# Patient Record
Sex: Male | Born: 1970 | Race: White | Hispanic: No | Marital: Single | Smoking: Current some day smoker
Health system: Southern US, Community
[De-identification: ages and names within clinical notes are randomized; demographics above are authoritative.]

## PROBLEM LIST (undated history)

## (undated) DIAGNOSIS — K219 Gastro-esophageal reflux disease without esophagitis: Secondary | ICD-10-CM

## (undated) DIAGNOSIS — E119 Type 2 diabetes mellitus without complications: Secondary | ICD-10-CM

## (undated) DIAGNOSIS — E785 Hyperlipidemia, unspecified: Secondary | ICD-10-CM

## (undated) DIAGNOSIS — R011 Cardiac murmur, unspecified: Secondary | ICD-10-CM

## (undated) DIAGNOSIS — N189 Chronic kidney disease, unspecified: Secondary | ICD-10-CM

## (undated) DIAGNOSIS — T7840XA Allergy, unspecified, initial encounter: Secondary | ICD-10-CM

## (undated) DIAGNOSIS — F419 Anxiety disorder, unspecified: Secondary | ICD-10-CM

## (undated) DIAGNOSIS — G4730 Sleep apnea, unspecified: Secondary | ICD-10-CM

## (undated) DIAGNOSIS — I10 Essential (primary) hypertension: Secondary | ICD-10-CM

## (undated) HISTORY — DX: Hyperlipidemia, unspecified: E78.5

## (undated) HISTORY — DX: Type 2 diabetes mellitus without complications: E11.9

## (undated) HISTORY — DX: Sleep apnea, unspecified: G47.30

## (undated) HISTORY — DX: Allergy, unspecified, initial encounter: T78.40XA

## (undated) HISTORY — DX: Gastro-esophageal reflux disease without esophagitis: K21.9

## (undated) HISTORY — DX: Anxiety disorder, unspecified: F41.9

## (undated) HISTORY — DX: Cardiac murmur, unspecified: R01.1

## (undated) HISTORY — DX: Chronic kidney disease, unspecified: N18.9

## (undated) HISTORY — PX: NO PAST SURGERIES: SHX2092

---

## 2007-02-19 ENCOUNTER — Telehealth: Payer: Self-pay | Admitting: Internal Medicine

## 2007-02-28 ENCOUNTER — Ambulatory Visit: Payer: Self-pay | Admitting: Internal Medicine

## 2007-02-28 DIAGNOSIS — F411 Generalized anxiety disorder: Secondary | ICD-10-CM

## 2007-02-28 DIAGNOSIS — IMO0002 Reserved for concepts with insufficient information to code with codable children: Secondary | ICD-10-CM

## 2007-02-28 LAB — CONVERTED CEMR LAB
Albumin: 4.5 g/dL (ref 3.5–5.2)
Alkaline Phosphatase: 61 units/L (ref 39–117)
Basophils Relative: 0.1 % (ref 0.0–1.0)
CO2: 28 meq/L (ref 19–32)
Calcium: 9.5 mg/dL (ref 8.4–10.5)
Chloride: 102 meq/L (ref 96–112)
Eosinophils Absolute: 0.1 10*3/uL (ref 0.0–0.6)
Eosinophils Relative: 0.7 % (ref 0.0–5.0)
GFR calc Af Amer: 110 mL/min
GFR calc non Af Amer: 91 mL/min
Glucose, Bld: 111 mg/dL — ABNORMAL HIGH (ref 70–99)
HCT: 44.3 % (ref 39.0–52.0)
Lymphocytes Relative: 16.2 % (ref 12.0–46.0)
MCV: 85.8 fL (ref 78.0–100.0)
Neutrophils Relative %: 77.2 % — ABNORMAL HIGH (ref 43.0–77.0)
Platelets: 287 10*3/uL (ref 150–400)
Potassium: 3.5 meq/L (ref 3.5–5.1)
RBC: 5.16 M/uL (ref 4.22–5.81)
Sodium: 138 meq/L (ref 135–145)
TSH: 1.42 microintl units/mL (ref 0.35–5.50)
WBC: 8.3 10*3/uL (ref 4.5–10.5)

## 2007-03-29 ENCOUNTER — Ambulatory Visit: Payer: Self-pay | Admitting: Internal Medicine

## 2007-04-29 ENCOUNTER — Telehealth: Payer: Self-pay | Admitting: Internal Medicine

## 2007-05-09 ENCOUNTER — Ambulatory Visit: Payer: Self-pay | Admitting: Internal Medicine

## 2010-05-17 ENCOUNTER — Other Ambulatory Visit (INDEPENDENT_AMBULATORY_CARE_PROVIDER_SITE_OTHER): Payer: BLUE CROSS/BLUE SHIELD | Admitting: Internal Medicine

## 2010-05-17 DIAGNOSIS — Z0000 Encounter for general adult medical examination without abnormal findings: Secondary | ICD-10-CM

## 2010-05-17 LAB — POCT URINALYSIS DIPSTICK
Glucose, UA: NEGATIVE
Nitrite, UA: NEGATIVE
pH, UA: 5.5

## 2010-05-17 LAB — BASIC METABOLIC PANEL
CO2: 28 mEq/L (ref 19–32)
Chloride: 102 mEq/L (ref 96–112)
Potassium: 3.8 mEq/L (ref 3.5–5.1)
Sodium: 143 mEq/L (ref 135–145)

## 2010-05-17 LAB — LIPID PANEL: HDL: 36.6 mg/dL — ABNORMAL LOW (ref >39.00)

## 2010-05-17 LAB — TSH: TSH: 1.63 u[IU]/mL (ref 0.35–5.50)

## 2010-05-17 LAB — CBC WITH DIFFERENTIAL/PLATELET
Basophils Relative: 0.7 % (ref 0.0–3.0)
Eosinophils Absolute: 0.3 10*3/uL (ref 0.0–0.7)
Eosinophils Relative: 2.1 % (ref 0.0–5.0)
Hemoglobin: 15.5 g/dL (ref 13.0–17.0)
Lymphocytes Relative: 18.9 % (ref 12.0–46.0)
MCHC: 34.3 g/dL (ref 30.0–36.0)
MCV: 87 fl (ref 78.0–100.0)
Monocytes Absolute: 0.6 10*3/uL (ref 0.1–1.0)
Neutro Abs: 8.7 10*3/uL — ABNORMAL HIGH (ref 1.4–7.7)
RBC: 5.2 Mil/uL (ref 4.22–5.81)

## 2010-05-17 LAB — HEPATIC FUNCTION PANEL
ALT: 28 U/L (ref 0–53)
Albumin: 4.3 g/dL (ref 3.5–5.2)
Alkaline Phosphatase: 69 U/L (ref 39–117)
Total Protein: 7.6 g/dL (ref 6.0–8.3)

## 2010-05-20 ENCOUNTER — Encounter: Payer: Self-pay | Admitting: Internal Medicine

## 2010-05-24 ENCOUNTER — Ambulatory Visit (INDEPENDENT_AMBULATORY_CARE_PROVIDER_SITE_OTHER): Payer: BLUE CROSS/BLUE SHIELD | Admitting: Internal Medicine

## 2010-05-24 ENCOUNTER — Encounter: Payer: Self-pay | Admitting: Internal Medicine

## 2010-05-24 DIAGNOSIS — IMO0002 Reserved for concepts with insufficient information to code with codable children: Secondary | ICD-10-CM

## 2010-05-24 DIAGNOSIS — Z0000 Encounter for general adult medical examination without abnormal findings: Secondary | ICD-10-CM

## 2010-05-24 DIAGNOSIS — F411 Generalized anxiety disorder: Secondary | ICD-10-CM

## 2010-05-24 DIAGNOSIS — G4733 Obstructive sleep apnea (adult) (pediatric): Secondary | ICD-10-CM

## 2010-05-24 MED ORDER — ALPRAZOLAM 0.5 MG PO TABS: 0.5000 mg | ORAL_TABLET | Freq: Three times a day (TID) | ORAL | Status: DC | PRN

## 2010-05-24 NOTE — Patient Instructions (Signed)
You need to lose weight.  Consider a lower calorie diet and regular exercise.  Limit your sodium (Salt) intake    It is important that you exercise regularly, at least 20 minutes 3 to 4 times per week.  If you develop chest pain or shortness of breath seek  medical attention.  Please check your blood pressure on a regular basis.  If it is consistently greater than 150/90, please make an office appointment.

## 2010-05-24 NOTE — Progress Notes (Signed)
Subjective:    Patient ID: Travis King, male    DOB: 06-Oct-1970, 40 y.o.   MRN: [id number]  HPI  40 year old patient who is seen today for a wellness exam. He has a history of exogenous obesity and unfortunately there is been some weight gain over the past few years. He has been a hypertensive suspect and has a strong family history of hypertension. There has been felt to be a component of white coat hypertension as well. Blood pressure readings are generally in a high normal range He has always been a loud snorer and there is some daytime fatigue and occasional daytime sleepiness. He has a history of anxiety disorder and did not do well on sertraline that interfered with his sleep he has not had angular for some time but feels that he would benefit from when necessary use of alprazolam which was quite helpful in the past   Review of Systems  Constitutional: Positive for unexpected weight change. Negative for fever, chills, activity change, appetite change and fatigue.  HENT: Negative for hearing loss, ear pain, congestion, rhinorrhea, sneezing, mouth sores, trouble swallowing, neck pain, neck stiffness, dental problem, voice change, sinus pressure and tinnitus.   Eyes: Negative for photophobia, pain, redness and visual disturbance.  Respiratory: Negative for apnea, cough, choking, chest tightness, shortness of breath and wheezing.   Cardiovascular: Negative for chest pain, palpitations and leg swelling.  Gastrointestinal: Negative for nausea, vomiting, abdominal pain, diarrhea, constipation, blood in stool, abdominal distention, anal bleeding and rectal pain.  Genitourinary: Negative for dysuria, urgency, frequency, hematuria, flank pain, decreased urine volume, discharge, penile swelling, scrotal swelling, difficulty urinating, genital sores and testicular pain.  Musculoskeletal: Negative for myalgias, back pain, joint swelling, arthralgias and gait problem.  Skin: Negative for color  change, rash and wound.  Neurological: Negative for dizziness, tremors, seizures, syncope, facial asymmetry, speech difficulty, weakness, light-headedness, numbness and headaches.  Hematological: Negative for adenopathy. Does not bruise/bleed easily.  Psychiatric/Behavioral: Negative for suicidal ideas, hallucinations, behavioral problems, confusion, sleep disturbance, self-injury, dysphoric mood, decreased concentration and agitation. The patient is nervous/anxious.        Objective:   Physical Exam  Constitutional: He appears well-developed and well-nourished.       Obese. Lowest blood pressure 130/90  HENT:  Head: Normocephalic and atraumatic.  Right Ear: External ear normal.  Left Ear: External ear normal.  Nose: Nose normal.  Mouth/Throat: Oropharynx is clear and moist.  Eyes: Conjunctivae and EOM are normal. Pupils are equal, round, and reactive to light. No scleral icterus.  Neck: Normal range of motion. Neck supple. No JVD present. No thyromegaly present.  Cardiovascular: Regular rhythm, normal heart sounds and intact distal pulses.  Exam reveals no gallop and no friction rub.   No murmur heard. Pulmonary/Chest: Effort normal and breath sounds normal. He exhibits no tenderness.  Abdominal: Soft. Bowel sounds are normal. He exhibits no distension and no mass. There is no tenderness.  Genitourinary: Prostate normal and penis normal.  Musculoskeletal: Normal range of motion. He exhibits no edema and no tenderness.  Lymphadenopathy:    He has no cervical adenopathy.  Neurological: He is alert. He has normal reflexes. No cranial nerve deficit. Coordination normal.  Skin: Skin is warm and dry. No rash noted.  Psychiatric: He has a normal mood and affect. His behavior is normal.          Assessment & Plan:  Exogenous obesity Rule out obstructive sleep apnea Hypertensive suspect. We'll continue to monitor home blood pressure  readings weight loss diet and home blood pressure  monitoring encouraged as well as a restricted salt diet Anxiety disorder. Will refill when necessary use of alprazolam which has been quite helpful in the past

## 2010-09-20 ENCOUNTER — Telehealth: Payer: Self-pay | Admitting: *Deleted

## 2010-09-20 NOTE — Telephone Encounter (Signed)
[facility] Triage Call Report Triage Record Num: [id number] Operator: Migdalia Dk Patient Name: Travis King Call Date & Time: 09/20/2010 1:14:40AM Patient Phone: [phone] PCP: Gordy Savers Patient Gender: Male PCP Fax : [phone] Patient DOB: Jan 16, 1971 [facility] Name[facility] Reason for Call: Pt. states has been on modified Carb diet over past week and has had problems with constipation. "I think I developed a hemorrhoid and I was using Abx cream on it. It had been oozing a clear liquid over the past couple of days. I noticed tonight when I put more cream on it was bleeding." States having small amount of bleeding from hemorrhoid tonight, "the pain is actually better", no constant rectal pain. Homecare advice given per Rectal Symptoms Guideline. Pt. to call office this a.m. for appt. Protocol(s) Used: Rectal Symptoms Recommended Outcome per Protocol: See Provider within 2 Weeks Reason for Outcome: Scant rectal bleeding (bright red blood on toilet tissue or drops of blood in toilet water) when straining at stool and not previously evaluated Care Advice: ~ Call provider if symptoms worsen or new symptoms develop. Consider use of Milk of Magnesia or other mild laxative, as directed on label, at bedtime (persons with renal disease should avoid Milk of Magnesia). Do not use laxatives or enemas regularly. ~ ~ Do not sit on toilet for a prolonged time (reading while on toilet) and avoid straining at stool. Consider use of nonprescription anti-inflammatory topical medication per pharmacist's or label recommendations (such as Preparation H, Anusol-HC, Cortacet) to relieve itching or burning. Apply small amount to area. ~ Constipation Care Measures: - Drink 8 to 10 glasses of liquid per day, more if breastfeeding. - Drink warm water or coffee early in the morning. - Gradually increase dietary fiber (fresh fruits/vegetables, whole grain bread and cereals). - As  tolerated, walk 30 minutes at a steady pace daily. - Consider nonprescription stool softeners (Colace) per label, pharmacist or provider recommendations. Stool softners are not habit forming as some stimulant laxatives may become. - Consider nonprescription bulk forming laxatives (such as Metamucil, FiberCon, Citrucel, etc.); follow package directions. - Avoid routine use of strong laxatives/enemas/suppositories unless ordered by provider. - Do not delay having bowel movement when having urge. - Keep a routine; attempt bowel movement within half hour after a meal or after some exercise. ~ 09/20/2010 1:26:18AM Page 1 of 1 CAN_TriageRpt_

## 2010-09-21 ENCOUNTER — Ambulatory Visit: Payer: BLUE CROSS/BLUE SHIELD | Admitting: Internal Medicine

## 2010-09-27 ENCOUNTER — Ambulatory Visit: Payer: BLUE CROSS/BLUE SHIELD | Admitting: Internal Medicine

## 2012-04-11 ENCOUNTER — Ambulatory Visit (INDEPENDENT_AMBULATORY_CARE_PROVIDER_SITE_OTHER): Payer: BC Managed Care – PPO | Admitting: Internal Medicine

## 2012-04-11 ENCOUNTER — Encounter: Payer: Self-pay | Admitting: Internal Medicine

## 2012-04-11 VITALS — BP 150/100 | Temp 98.1°F | Wt 282.0 lb

## 2012-04-11 DIAGNOSIS — E6609 Other obesity due to excess calories: Secondary | ICD-10-CM | POA: Insufficient documentation

## 2012-04-11 DIAGNOSIS — G4733 Obstructive sleep apnea (adult) (pediatric): Secondary | ICD-10-CM

## 2012-04-11 DIAGNOSIS — E669 Obesity, unspecified: Secondary | ICD-10-CM

## 2012-04-11 MED ORDER — ALPRAZOLAM 0.5 MG PO TABS: 0.5000 mg | ORAL_TABLET | Freq: Three times a day (TID) | ORAL | Status: AC | PRN

## 2012-04-11 MED ORDER — SILDENAFIL CITRATE 100 MG PO TABS: 100.0000 mg | ORAL_TABLET | Freq: Every day | ORAL | Status: DC | PRN

## 2012-04-11 MED ORDER — FLUTICASONE PROPIONATE 50 MCG/ACT NA SUSP: 2.0000 | Freq: Every day | NASAL | Status: DC

## 2012-04-11 NOTE — Progress Notes (Signed)
  Subjective:    Patient ID: Travis King, male    DOB: [birth year], 42 y.o.   MRN: [id number]  HPI  42 year old patient who is seen today for followup. He has a number of concerns. He had a number of questions concerning  supplements. He is exercising at a health club regularly and attempting to lose weight. He does have a history of OSA and uses home CPAP infrequently.  BP Readings from Last 3 Encounters:  04/11/12 150/100  05/24/10 150/90  05/09/07 140/90    Review of Systems  Constitutional: Positive for fatigue. Negative for fever, chills and appetite change.  HENT: Positive for congestion. Negative for hearing loss, ear pain, sore throat, trouble swallowing, neck stiffness, dental problem, voice change and tinnitus.   Eyes: Negative for pain, discharge and visual disturbance.  Respiratory: Negative for cough, chest tightness, wheezing and stridor.   Cardiovascular: Negative for chest pain, palpitations and leg swelling.  Gastrointestinal: Negative for nausea, vomiting, abdominal pain, diarrhea, constipation, blood in stool and abdominal distention.  Genitourinary: Negative for urgency, hematuria, flank pain, discharge, difficulty urinating and genital sores.  Musculoskeletal: Negative for myalgias, back pain, joint swelling, arthralgias and gait problem.  Skin: Negative for rash.  Neurological: Negative for dizziness, syncope, speech difficulty, weakness, numbness and headaches.  Hematological: Negative for adenopathy. Does not bruise/bleed easily.  Psychiatric/Behavioral: Negative for behavioral problems and dysphoric mood. The patient is not nervous/anxious.        Objective:   Physical Exam  Constitutional: He is oriented to person, place, and time. He appears well-developed.  Weight 282 Repeat blood pressure 140/90  HENT:  Head: Normocephalic.  Right Ear: External ear normal.  Left Ear: External ear normal.  Eyes: Conjunctivae and EOM are normal.  Neck: Normal range  of motion.  Cardiovascular: Normal rate and normal heart sounds.   Pulmonary/Chest: Breath sounds normal.  Abdominal: Bowel sounds are normal.  Musculoskeletal: Normal range of motion. He exhibits no edema and no tenderness.  Neurological: He is alert and oriented to person, place, and time.  Psychiatric: He has a normal mood and affect. His behavior is normal.          Assessment & Plan:   Exogenous obesity OSA. Compliance with home CPAP encouraged  Weight loss regular exercise regimen encouraged home blood pressure monitoring encouraged

## 2012-04-11 NOTE — Patient Instructions (Addendum)
It is important that you exercise regularly, at least 20 minutes 3 to 4 times per week.  If you develop chest pain or shortness of breath seek  medical attention.  You need to lose weight.  Consider a lower calorie diet and regular exercise.   Use saline irrigation, warm  moist compresses and over-the-counter decongestants only as directed.  Call if there is no improvement in 5 to 7 days, or sooner if you develop increasing pain, fever, or any new symptoms.

## 2012-06-07 ENCOUNTER — Telehealth: Payer: Self-pay | Admitting: Internal Medicine

## 2012-06-07 NOTE — Telephone Encounter (Signed)
[facility] Triage Call Report Triage Record Num: [id number] Operator: Rene Kocher Patient Name: Travis King Call Date & Time: 06/06/2012 9:33:16PM Patient Phone: [phone] PCP: Patient Gender: Male PCP Fax : Patient DOB: [birth year] [facility] Name[facility] Reason for Call: Caller: [name]/Patient; PCP: Eleonore Chiquito [facility]); CB#: [phone]; Call regarding Questions about xanex, re: traveling; 06/06/12 patient calling to see if he can take Xanax 06/07/12 during a business trip and then consume ETOH at the business dinner several hours later. Called [facility] [phone], [address], and connected patient with Wynetta Emery, RPh to give advise for medication. No further needs at this time. Protocol(s) Used: Information Only Call; No Symptom Triage (Adult) Recommended Outcome per Protocol: Provide Information or Advice Only Reason for Outcome: Health information question; person denies any symptoms, no triage required. Information provided from approved references or clinical experience. Care Advice: ~ 05/

## 2013-03-25 ENCOUNTER — Emergency Department (HOSPITAL_COMMUNITY)
Admission: EM | Admit: 2013-03-25 | Discharge: 2013-03-25 | Disposition: A | Discharge status: Home / Self Care | Payer: BC Managed Care – PPO | Source: Home / Self Care

## 2013-03-25 ENCOUNTER — Encounter (HOSPITAL_COMMUNITY): Payer: Self-pay | Admitting: Emergency Medicine

## 2013-03-25 DIAGNOSIS — J029 Acute pharyngitis, unspecified: Secondary | ICD-10-CM

## 2013-03-25 DIAGNOSIS — J390 Retropharyngeal and parapharyngeal abscess: Secondary | ICD-10-CM

## 2013-03-25 HISTORY — DX: Essential (primary) hypertension: I10

## 2013-03-25 LAB — POCT RAPID STREP A: STREPTOCOCCUS, GROUP A SCREEN (DIRECT): NEGATIVE

## 2013-03-25 MED ORDER — CLINDAMYCIN HCL 300 MG PO CAPS: 300.0000 mg | ORAL_CAPSULE | Freq: Four times a day (QID) | ORAL | Status: DC

## 2013-03-25 NOTE — ED Provider Notes (Signed)
CSN: [id number]     Arrival date & time 03/25/13  1124 History   First MD Initiated Contact with Patient 03/25/13 1132     Chief Complaint  Patient presents with  . Sore Throat     (Consider location/radiation/quality/duration/timing/severity/associated sxs/prior Treatment) HPI Comments: -year-old obese male complaining of swelling of the tonsils and sore throat that began about 4 days ago. This morning he states he had an otic temperature of 100. [place] 3 weeks ago he was treated by his PCP for positive strep throat. He took antibiotics for the full 10 days and felt much better. He uses CPAP and states he forgot to clean it and believes he may have re\re contaminated himself.   Past Medical History  Diagnosis Date  . Anxiety   . Hypertension    History reviewed. No pertinent past surgical history. Family History  Problem Relation Age of Onset  . Heart attack Mother   . Hypertension Mother   . Diabetes Mother   . Dementia Father    History  Substance Use Topics  . Smoking status: Current Some Day Smoker    Types: Cigars  . Smokeless tobacco: Never Used  . Alcohol Use: Yes    Review of Systems  Constitutional: Positive for fever and appetite change.  HENT: Positive for sore throat and voice change. Negative for congestion, ear pain, postnasal drip, rhinorrhea, sinus pressure and trouble swallowing.   Eyes: Negative.   Respiratory: Negative.   Cardiovascular: Negative.   Gastrointestinal: Negative.   Genitourinary: Negative.   Neurological: Negative.       Allergies  Review of patient's allergies indicates no known allergies.  Home Medications   Current Outpatient Rx  Name  Route  Sig  Dispense  Refill  . ALPRAZolam (XANAX) 0.5 MG tablet   Oral   Take 1 tablet (0.5 mg total) by mouth 3 (three) times daily as needed for sleep or anxiety.   30 tablet   3   . clindamycin (CLEOCIN) 300 MG capsule   Oral   Take 1 capsule (300 mg total) by mouth 4 (four) times  daily. X 10 days   40 capsule   0   . fluticasone (FLONASE) 50 MCG/ACT nasal spray   Nasal   Place 2 sprays into the nose daily.   16 g   6   . sildenafil (VIAGRA) 100 MG tablet   Oral   Take 1 tablet (100 mg total) by mouth daily as needed for erectile dysfunction.   3 tablet   11    BP 152/108  Pulse 72  Temp(Src) 98.6 F (37 C) (Oral)  Resp 18  SpO2 100% Physical Exam  Nursing note and vitals reviewed. Constitutional: He is oriented to person, place, and time. He appears well-developed and well-nourished. No distress.  HENT:  Right Ear: External ear normal.  Left Ear: External ear normal.  Mouth/Throat: Oropharyngeal exudate present.  Oropharynx is crowded by redundant tissue as the patient is obese. The right tonsil is enlarged and erythematous. There is a bulge in the right palatine arch and forcing the uvula left of midline. The airway is narrowed but patent.  Eyes: EOM are normal.  Neck: Normal range of motion. Neck supple.  Cardiovascular: Normal rate.   Pulmonary/Chest: Effort normal. No respiratory distress.  Musculoskeletal: He exhibits no edema.  Neurological: He is alert and oriented to person, place, and time. He exhibits normal muscle tone.  Skin: Skin is warm and dry.    ED Course  Procedures (including critical care time) Labs Review Labs Reviewed  POCT RAPID STREP A ([facility] ONLY)   Imaging Review No results found. Results for orders placed during the hospital encounter of 03/25/13  POCT RAPID STREP A ([facility] ONLY)      Result Value Ref Range   Streptococcus, Group A Screen (Direct) NEGATIVE  NEGATIVE      MDM   Final diagnoses:  Pharyngitis  Parapharyngeal abscess    Spoke with Dr. Jearld Fenton via phone. Agreed to tx with clindamycin and see in office tommorow.  Pt advised for any worsening, narrowing of airway, choking, developing fevers go to the ED. D/C in stable condition, airway patent, relaxed posturing, no Sh OB, afebrile,  able to speak well with good voice tone.    Hayden Rasmussen, NP 03/25/13 1229  Hayden Rasmussen, NP 03/25/13 1230  Hayden Rasmussen, NP 03/25/13 1235

## 2013-03-25 NOTE — ED Notes (Signed)
Pt  Reports  Symptoms  Of  sorethroat         Pain  When  He  Swallows     With  The  Symptoms   X  3  Days   -  He  Reports  Was  Seen   sev  Weeks   Ago                     For  Throat infection

## 2013-03-25 NOTE — Discharge Instructions (Signed)
Retropharyngeal Abscess A retropharyngeal abscess is an abscess that is located in the back of the throat. An abscess is an area infected by bacteria and contains a collection of pus.  SYMPTOMS   On one or both sides of the back of the throat, an abscess usually causes:  Swelling.  Pain.  Tenderness.  Inflammation.  High fever.  Stiff neck.  Occasionally, difficulty breathing.  Difficulty or pain when swallowing or both.  Muffled voice. DIAGNOSIS  The diagnosis is often made after a physical exam. Sometimes specialized X-ray exams are done. TREATMENT  The treatment of retropharyngeal abscess usually consists of:  Antibiotic medicines.  Drainage (an incision is made over the abscess, and the pus is drained out of the abscess). HOME CARE INSTRUCTIONS  Only take over-the-counter or prescription medicines for pain, discomfort, or fever as directed by your health care provider.  SEEK MEDICAL CARE IF:  You cannot swallow or you are starting to drool because of pain when you swallow.  You are not able to lie flat without feeling short of breath. SEEK IMMEDIATE MEDICAL CARE IF:   You develop increased pain, swelling, drainage, or bleeding in the wound site.  You develop signs of generalized infection including muscle aches, chills, fever, or a general ill feeling.  You develop a stiff neck or severe headache not relieved with medicines.  You have a fever.  You have a worsening of any of the problems that brought you to your health care provider. MAKE SURE YOU:  Understand these instructions.   Will watch your condition.   Will get help right away if you are not doing well or get worse. Document Released: 05/07/2000 Document Revised: 11/13/2012 Document Reviewed: 08/20/2012 [facility] Patient Information 2014 [place], [place].  Sore Throat A sore throat is pain, burning, irritation, or scratchiness of the throat. There is often pain or tenderness when swallowing or  talking. A sore throat may be accompanied by other symptoms, such as coughing, sneezing, fever, and swollen neck glands. A sore throat is often the first sign of another sickness, such as a cold, flu, strep throat, or mononucleosis (commonly known as mono). Most sore throats go away without medical treatment. CAUSES  The most common causes of a sore throat include:  A viral infection, such as a cold, flu, or mono.  A bacterial infection, such as strep throat, tonsillitis, or whooping cough.  Seasonal allergies.  Dryness in the air.  Irritants, such as smoke or pollution.  Gastroesophageal reflux disease (GERD). HOME CARE INSTRUCTIONS   Only take over-the-counter medicines as directed by your caregiver.  Drink enough fluids to keep your urine clear or pale yellow.  Rest as needed.  Try using throat sprays, lozenges, or sucking on hard candy to ease any pain (if older than 4 years or as directed).  Sip warm liquids, such as broth, herbal tea, or warm water with honey to relieve pain temporarily. You may also eat or drink cold or frozen liquids such as frozen ice pops.  Gargle with salt water (mix 1 tsp salt with 8 oz of water).  Do not smoke and avoid secondhand smoke.  Put a cool-mist humidifier in your bedroom at night to moisten the air. You can also turn on a hot shower and sit in the bathroom with the door closed for 5 10 minutes. SEEK IMMEDIATE MEDICAL CARE IF:  You have difficulty breathing.  You are unable to swallow fluids, soft foods, or your saliva.  You have increased swelling in  the throat.  Your sore throat does not get better in 7 days.  You have nausea and vomiting.  You have a fever or persistent symptoms for more than 2 3 days.  You have a fever and your symptoms suddenly get worse. MAKE SURE YOU:   Understand these instructions.  Will watch your condition.  Will get help right away if you are not doing well or get worse. Document Released:  03/02/2004 Document Revised: 01/10/2012 Document Reviewed: 10/01/2011 [facility] Patient Information 2014 [place], [place].

## 2013-03-26 ENCOUNTER — Telehealth (HOSPITAL_COMMUNITY): Payer: Self-pay | Admitting: *Deleted

## 2013-03-26 LAB — CULTURE, GROUP A STREP

## 2013-03-26 NOTE — ED Notes (Signed)
Throat culture: group A Strep (S. Pyogenes).  Pt. adequately treated with Cleocin per Dr. Artis FlockKindl. Travis King, Travis King 03/26/2013

## 2013-03-26 NOTE — ED Provider Notes (Signed)
Medical screening examination/treatment/procedure(s) were performed by resident physician or non-physician practitioner and as supervising physician I was immediately available for consultation/collaboration.   Eleno Weimar DOUGLAS MD.   Lettie Czarnecki D Alzena Gerber, MD 03/26/13 2008 

## 2013-04-24 ENCOUNTER — Emergency Department (HOSPITAL_COMMUNITY): Payer: BC Managed Care – PPO

## 2013-04-24 ENCOUNTER — Emergency Department (HOSPITAL_COMMUNITY)
Admission: EM | Admit: 2013-04-24 | Discharge: 2013-04-24 | Disposition: A | Discharge status: Home / Self Care | Payer: BC Managed Care – PPO | Attending: Emergency Medicine | Admitting: Emergency Medicine

## 2013-04-24 ENCOUNTER — Encounter (HOSPITAL_COMMUNITY): Payer: Self-pay | Admitting: Emergency Medicine

## 2013-04-24 DIAGNOSIS — I10 Essential (primary) hypertension: Secondary | ICD-10-CM | POA: Insufficient documentation

## 2013-04-24 DIAGNOSIS — F17200 Nicotine dependence, unspecified, uncomplicated: Secondary | ICD-10-CM | POA: Insufficient documentation

## 2013-04-24 DIAGNOSIS — J36 Peritonsillar abscess: Secondary | ICD-10-CM | POA: Insufficient documentation

## 2013-04-24 DIAGNOSIS — Z8659 Personal history of other mental and behavioral disorders: Secondary | ICD-10-CM | POA: Insufficient documentation

## 2013-04-24 DIAGNOSIS — R000 Tachycardia, unspecified: Secondary | ICD-10-CM | POA: Insufficient documentation

## 2013-04-24 DIAGNOSIS — Z792 Long term (current) use of antibiotics: Secondary | ICD-10-CM | POA: Insufficient documentation

## 2013-04-24 DIAGNOSIS — IMO0002 Reserved for concepts with insufficient information to code with codable children: Secondary | ICD-10-CM | POA: Insufficient documentation

## 2013-04-24 DIAGNOSIS — J020 Streptococcal pharyngitis: Secondary | ICD-10-CM | POA: Insufficient documentation

## 2013-04-24 LAB — CBC WITH DIFFERENTIAL/PLATELET
BASOS PCT: 0 % (ref 0–1)
Basophils Absolute: 0.1 10*3/uL (ref 0.0–0.1)
EOS ABS: 0.1 10*3/uL (ref 0.0–0.7)
Eosinophils Relative: 1 % (ref 0–5)
HEMATOCRIT: 43.7 % (ref 39.0–52.0)
HEMOGLOBIN: 14.9 g/dL (ref 13.0–17.0)
Lymphocytes Relative: 14 % (ref 12–46)
Lymphs Abs: 2.4 10*3/uL (ref 0.7–4.0)
MCH: 29.2 pg (ref 26.0–34.0)
MCHC: 34.1 g/dL (ref 30.0–36.0)
MCV: 85.5 fL (ref 78.0–100.0)
MONO ABS: 1.1 10*3/uL — AB (ref 0.1–1.0)
Monocytes Relative: 7 % (ref 3–12)
Neutro Abs: 12.9 10*3/uL — ABNORMAL HIGH (ref 1.7–7.7)
Neutrophils Relative %: 78 % — ABNORMAL HIGH (ref 43–77)
Platelets: 309 10*3/uL (ref 150–400)
RBC: 5.11 MIL/uL (ref 4.22–5.81)
RDW: 13.1 % (ref 11.5–15.5)
WBC: 16.6 10*3/uL — ABNORMAL HIGH (ref 4.0–10.5)

## 2013-04-24 LAB — COMPREHENSIVE METABOLIC PANEL
ALBUMIN: 3.7 g/dL (ref 3.5–5.2)
ALT: 26 U/L (ref 0–53)
AST: 15 U/L (ref 0–37)
Alkaline Phosphatase: 67 U/L (ref 39–117)
BILIRUBIN TOTAL: 0.6 mg/dL (ref 0.3–1.2)
BUN: 11 mg/dL (ref 6–23)
CHLORIDE: 99 meq/L (ref 96–112)
CO2: 24 mEq/L (ref 19–32)
CREATININE: 1.1 mg/dL (ref 0.50–1.35)
Calcium: 9.1 mg/dL (ref 8.4–10.5)
GFR calc non Af Amer: 82 mL/min — ABNORMAL LOW (ref >90)
Glucose, Bld: 130 mg/dL — ABNORMAL HIGH (ref 70–99)
Potassium: 3.6 mEq/L — ABNORMAL LOW (ref 3.7–5.3)
Sodium: 140 mEq/L (ref 137–147)
TOTAL PROTEIN: 7.3 g/dL (ref 6.0–8.3)

## 2013-04-24 LAB — RAPID STREP SCREEN (MED CTR MEBANE ONLY): Streptococcus, Group A Screen (Direct): POSITIVE — AB

## 2013-04-24 MED ORDER — DEXAMETHASONE SODIUM PHOSPHATE 10 MG/ML IJ SOLN
10.0000 mg | Freq: Once | INTRAMUSCULAR | Status: AC
  Administered 2013-04-24: 10 mg via INTRAVENOUS
  Filled 2013-04-24: qty 1

## 2013-04-24 MED ORDER — DEXTROSE 5 % IV SOLN
1.0000 g | Freq: Once | INTRAVENOUS | Status: AC
  Administered 2013-04-24: 1 g via INTRAVENOUS
  Filled 2013-04-24: qty 10

## 2013-04-24 MED ORDER — SODIUM CHLORIDE 0.9 % IV SOLN
1000.0000 mL | Freq: Once | INTRAVENOUS | Status: AC
  Administered 2013-04-24: 1000 mL via INTRAVENOUS

## 2013-04-24 MED ORDER — SODIUM CHLORIDE 0.9 % IV SOLN: 1000.0000 mL | INTRAVENOUS | Status: DC

## 2013-04-24 MED ORDER — IOHEXOL 300 MG/ML  SOLN
80.0000 mL | Freq: Once | INTRAMUSCULAR | Status: AC | PRN
  Administered 2013-04-24: 80 mL via INTRAVENOUS

## 2013-04-24 MED ORDER — GI COCKTAIL ~~LOC~~
30.0000 mL | Freq: Once | ORAL | Status: AC
  Administered 2013-04-24: 30 mL via ORAL
  Filled 2013-04-24: qty 30

## 2013-04-24 NOTE — Discharge Instructions (Signed)
Go directly to Dr. Lucky Rathkeosen's office. He will take care of the abscess there.  Peritonsillar Abscess A peritonsillar abscess is a collection of pus located in the back of the throat behind the tonsils. It usually occurs when a streptococcal infection of the throat or tonsils spreads into the space around the tonsils. They are almost always caused by the streptococcal germ (bacteria). The treatment of a peritonsillar abscess is most often drainage accomplished by putting a needle into the abscess or cutting (incising) and draining the abscess. This is most often followed with a course of antibiotics. HOME CARE INSTRUCTIONS  If your abscess was drained by your caregiver today, rinse your throat (gargle) with warm salt water four times per day or as needed for comfort. Do not swallow this mixture. Mix 1 teaspoon of salt in 8 ounces of warm water for gargling.  Rest in bed as needed. Resume activities as able.  Apply cold to your neck for pain relief. Fill a plastic bag with ice and wrap it in a towel. Hold the ice on your neck for 20 minutes 4 times per day.  Eat a soft or liquid diet as tolerated while your throat remains sore. Popsicles and ice cream may be good early choices. Drinking plenty of cold fluids will probably be soothing and help take swelling down in between the warm gargles.  Only take over-the-counter or prescription medicines for pain, discomfort, or fever as directed by your caregiver. Do not use aspirin unless directed by your physician. Aspirin slows down the clotting process. It can also cause bleeding from the drainage area if this was needled or incised today.  If antibiotics were prescribed, take them as directed for the full course of the prescription. Even if you feel you are well, you need to take them. SEEK MEDICAL CARE IF:   You have increased pain, swelling, redness, or drainage in your throat.  You develop signs of infection such as dizziness, headache, lethargy, or  generalized feelings of illness.  You have difficulty breathing, swallowing or eating.  You show signs of becoming dehydrated (lightheadedness when standing, decreased urine output, a fast heart rate, or dry mouth and mucous membranes). SEEK IMMEDIATE MEDICAL CARE IF:   You have a fever.  You are coughing up or vomiting blood.  You develop more severe throat pain uncontrolled with medicines or you start to drool.  You develop difficulty breathing, talking, or find it easier to breathe while leaning forward. Document Released: 01/23/2005 Document Revised: 04/17/2011 Document Reviewed: 09/06/2007 [facility] Patient Information 2014 [employer] [place].  Strep Throat Strep throat is an infection of the throat caused by a bacteria named Streptococcus pyogenes. Your caregiver may call the infection streptococcal "tonsillitis" or "pharyngitis" depending on whether there are signs of inflammation in the tonsils or back of the throat. Strep throat is most common in children aged 5 15 years during the cold months of the year, but it can occur in people of any age during any season. This infection is spread from person to person (contagious) through coughing, sneezing, or other close contact. SYMPTOMS   Fever or chills.  Painful, swollen, red tonsils or throat.  Pain or difficulty when swallowing.  White or yellow spots on the tonsils or throat.  Swollen, tender lymph nodes or "glands" of the neck or under the jaw.  Red rash all over the body (rare). DIAGNOSIS  Many different infections can cause the same symptoms. A test must be done to confirm the diagnosis so the  right treatment can be given. A "rapid strep test" can help your caregiver make the diagnosis in a few minutes. If this test is not available, a light swab of the infected area can be used for a throat culture test. If a throat culture test is done, results are usually available in a day or two. TREATMENT  Strep throat is treated with  antibiotic medicine. HOME CARE INSTRUCTIONS   Gargle with 1 tsp of salt in 1 cup of warm water, 3 4 times per day or as needed for comfort.  Family members who also have a sore throat or fever should be tested for strep throat and treated with antibiotics if they have the strep infection.  Make sure everyone in your household washes their hands well.  Do not share food, drinking cups, or personal items that could cause the infection to spread to others.  You may need to eat a soft food diet until your sore throat gets better.  Drink enough water and fluids to keep your urine clear or pale yellow. This will help prevent dehydration.  Get plenty of rest.  Stay home from school, daycare, or work until you have been on antibiotics for 24 hours.  Only take over-the-counter or prescription medicines for pain, discomfort, or fever as directed by your caregiver.  If antibiotics are prescribed, take them as directed. Finish them even if you start to feel better. SEEK MEDICAL CARE IF:   The glands in your neck continue to enlarge.  You develop a rash, cough, or earache.  You cough up green, yellow-brown, or bloody sputum.  You have pain or discomfort not controlled by medicines.  Your problems seem to be getting worse rather than better. SEEK IMMEDIATE MEDICAL CARE IF:   You develop any new symptoms such as vomiting, severe headache, stiff or painful neck, chest pain, shortness of breath, or trouble swallowing.  You develop severe throat pain, drooling, or changes in your voice.  You develop swelling of the neck, or the skin on the neck becomes red and tender.  You have a fever.  You develop signs of dehydration, such as fatigue, dry mouth, and decreased urination.  You become increasingly sleepy, or you cannot wake up completely. Document Released: 01/21/2000 Document Revised: 01/10/2012 Document Reviewed: 03/24/2010 [facility] Patient Information 2014 [place], [place].

## 2013-04-24 NOTE — ED Notes (Signed)
Pt reports having strep throat twice in the past month. Pt reports finishing antibiotics 5 days ago. Pt said that he has had intermittent swelling to his throat for the past 3 days but felt like it was much worse today.

## 2013-04-24 NOTE — ED Notes (Signed)
Pt will be going to ENT dr at 9 am antibiotic done

## 2013-04-24 NOTE — ED Provider Notes (Signed)
CSN: [id number]     Arrival date & time 04/24/13  0442 History   First MD Initiated Contact with Patient 04/24/13 0507     Chief Complaint  Patient presents with  . Sore Throat  . Tachycardia     (Consider location/radiation/quality/duration/timing/severity/associated sxs/prior Treatment) Patient is a 43 y.o. male presenting with pharyngitis. The history is provided by the patient.  Sore Throat  He started having a sore throat this evening which has progressively gotten worse. His wife had strep throat earlier this week and may have exposed him to strep. He has also been having problems with 2 other episodes of strep throat in the past month. He saw an ear nose throat physician and advised him that he may need to have his tonsils removed. Today, he rates pain at 4/10. There is pain with swallowing but is able to swallow his saliva. Pain is worse on the right side of his throat. He had subjective fever but no chills or sweats. There's been no arthralgias or myalgias and no nausea or vomiting. He just completed a course of antibiotics 5 days ago.  Past Medical History  Diagnosis Date  . Anxiety   . Hypertension    History reviewed. No pertinent past surgical history. Family History  Problem Relation Age of Onset  . Heart attack Mother   . Hypertension Mother   . Diabetes Mother   . Dementia Father    History  Substance Use Topics  . Smoking status: Current Some Day Smoker    Types: Cigars  . Smokeless tobacco: Never Used  . Alcohol Use: Yes    Review of Systems  All other systems reviewed and are negative.      Allergies  Review of patient's allergies indicates no known allergies.  Home Medications   Current Outpatient Rx  Name  Route  Sig  Dispense  Refill  . clindamycin (CLEOCIN) 300 MG capsule   Oral   Take 1 capsule (300 mg total) by mouth 4 (four) times daily. X 10 days   40 capsule   0   . fluticasone (FLONASE) 50 MCG/ACT nasal spray   Nasal   Place 2  sprays into the nose daily.   16 g   6   . sildenafil (VIAGRA) 100 MG tablet   Oral   Take 1 tablet (100 mg total) by mouth daily as needed for erectile dysfunction.   3 tablet   11    BP 154/106  Pulse 138  Temp(Src) 98.4 F (36.9 C) (Oral)  Resp 18  Ht 6\' 2"  (1.88 m)  Wt 290 lb (131.543 kg)  BMI 37.22 kg/m2  SpO2 99% Physical Exam  Nursing note and vitals reviewed.  43 year old male, resting comfortably and in no acute distress. Vital signs are significant for tachycardia with heart rate 138, and hypertension with blood pressure 154/106. Oxygen saturation is 99%, which is normal. Head is normocephalic and atraumatic. PERRLA, EOMI. Oropharynx is moderately erythematous without exudates. There is swelling of the right peritonsillar area consistent with abscess. However, uvula does move in the midline. Voice is slightly muffled but he has no difficulty with his secretions. Neck is nontender and supple without adenopathy or JVD. Back is nontender and there is no CVA tenderness. Lungs are clear without rales, wheezes, or rhonchi. Chest is nontender. Heart has regular rate and rhythm without murmur. Abdomen is soft, flat, nontender without masses or hepatosplenomegaly and peristalsis is normoactive. Extremities have no cyanosis or edema, full range  of motion is present. Skin is warm and dry without rash. Neurologic: Mental status is normal, cranial nerves are intact, there are no motor or sensory deficits.  ED Course  Procedures (including critical care time) Labs Review Results for orders placed during the hospital encounter of 04/24/13  RAPID STREP SCREEN      Result Value Ref Range   Streptococcus, Group A Screen (Direct) POSITIVE (*) NEGATIVE  CBC WITH DIFFERENTIAL      Result Value Ref Range   WBC 16.6 (*) 4.0 - 10.5 K/uL   RBC 5.11  4.22 - 5.81 MIL/uL   Hemoglobin 14.9  13.0 - 17.0 g/dL   HCT 16.143.7  09.639.0 - 04.552.0 %   MCV 85.5  78.0 - 100.0 fL   MCH 29.2  26.0 - 34.0 pg    MCHC 34.1  30.0 - 36.0 g/dL   RDW 40.913.1  81.111.5 - 91.415.5 %   Platelets 309  150 - 400 K/uL   Neutrophils Relative % 78 (*) 43 - 77 %   Neutro Abs 12.9 (*) 1.7 - 7.7 K/uL   Lymphocytes Relative 14  12 - 46 %   Lymphs Abs 2.4  0.7 - 4.0 K/uL   Monocytes Relative 7  3 - 12 %   Monocytes Absolute 1.1 (*) 0.1 - 1.0 K/uL   Eosinophils Relative 1  0 - 5 %   Eosinophils Absolute 0.1  0.0 - 0.7 K/uL   Basophils Relative 0  0 - 1 %   Basophils Absolute 0.1  0.0 - 0.1 K/uL  COMPREHENSIVE METABOLIC PANEL      Result Value Ref Range   Sodium 140  137 - 147 mEq/L   Potassium 3.6 (*) 3.7 - 5.3 mEq/L   Chloride 99  96 - 112 mEq/L   CO2 24  19 - 32 mEq/L   Glucose, Bld 130 (*) 70 - 99 mg/dL   BUN 11  6 - 23 mg/dL   Creatinine, Ser 7.821.10  0.50 - 1.35 mg/dL   Calcium 9.1  8.4 - 95.610.5 mg/dL   Total Protein 7.3  6.0 - 8.3 g/dL   Albumin 3.7  3.5 - 5.2 g/dL   AST 15  0 - 37 U/L   ALT 26  0 - 53 U/L   Alkaline Phosphatase 67  39 - 117 U/L   Total Bilirubin 0.6  0.3 - 1.2 mg/dL   GFR calc non Af Amer 82 (*) >90 mL/min   GFR calc Af Amer >90  >90 mL/min   Imaging Review Ct Soft Tissue Neck W Contrast  04/24/2013   CLINICAL DATA:  Right peritonsillar swelling  EXAM: CT NECK WITH CONTRAST  TECHNIQUE: Multidetector CT imaging of the neck was performed using the standard protocol following the bolus administration of intravenous contrast.  CONTRAST:  80mL OMNIPAQUE IOHEXOL 300 MG/ML  SOLN  COMPARISON:  None.  FINDINGS: The visualized portions of the brain and orbits are within normal limits.  The paranasal sinuses are clear.  No mastoid effusion.  The salivary glands including the parotid glands and submandibular glands are within normal limits.  The oral cavity is normal. The tonsils are mildly enlarged bilaterally. A loculated rim enhancing collection measuring 1.8 x 1.6 x 2.6 cm is seen adjacent to the right tonsil (series 3, image 44), compatible with a right peritonsillar abscess. There is secondary medial  displacement of the right tonsil, which abuts the contralateral left tonsil at the midline. There is secondary effacement and narrowing of the  or pharyngeal airway. The airway itself is deviated to the left.  Nasopharynx within normal limits. No retropharyngeal collection. Hypopharynx and supraglottic larynx are normal. Epiglottis is normal. Vallecula is clear. True vocal cords are normal. Subglottic airway is clear.  Thyroid gland is normal.  Enlarged right level 2 adenopathy measuring up to 1.7 cm in short access is present (series 3, image 56). An enlarged 1.4 cm left level 2 node is present (series 3, image 53). Additional scattered shotty adenopathy seen within the neck. No adenopathy seen within the superior mediastinum.  The visualized lungs are clear.  Normal intravascular enhancement seen within the neck.  No acute osseous abnormality. No focal lytic or blastic osseous lesions.  IMPRESSION: 1. 1.8 x 1.6 x 2.6 cm right peritonsillar abscess with secondary mass effect on and displacement of the adjacent oropharyngeal airway as above. 2. Bilateral level II cervical adenopathy, likely reactive.   Electronically Signed   By: Rise Mu M.D.   On: 04/24/2013 06:54   Images viewed by me, discussed with radiologist.   EKG Interpretation   Date/Time:  Thursday April 24 2013 04:54:23 EDT Ventricular Rate:  132 PR Interval:  132 QRS Duration: 109 QT Interval:  428 QTC Calculation: 634 R Axis:   36 Text Interpretation:  Sinus tachycardia Consider right atrial enlargement  Prolonged QT interval No old tracing to compare Confirmed by [facility]  MD,  Taraya Steward ([id number]) on 04/24/2013 4:58:22 AM      MDM   Final diagnoses:  Peritonsillar abscess  Streptococcal pharyngitis    Recurrent sore throat with possible peritonsillar abscess. Old records are reviewed and he was seen in urgent care one month ago with similar complaints and similar findings on exam. You'll be sent for CT scan to evaluate  possible peritonsillar abscess. In the meantime, he is given IV fluids and IV dexamethasone. Strep screen will be obtained.  Strep screen is positive. CT shows evidence of a fairly large peritonsillar abscess. Case has been discussed with Dr. Pollyann Kennedy of ENT who requested patient be sent to his office for definitive treatment. He is given a dose of ceftriaxone in the ED.  Dione Booze, MD 04/24/13 [phone]

## 2013-11-21 ENCOUNTER — Other Ambulatory Visit: Payer: Self-pay

## 2016-03-16 ENCOUNTER — Encounter: Payer: Self-pay | Admitting: Internal Medicine

## 2016-03-16 ENCOUNTER — Ambulatory Visit (INDEPENDENT_AMBULATORY_CARE_PROVIDER_SITE_OTHER): Payer: PRIVATE HEALTH INSURANCE | Admitting: Internal Medicine

## 2016-03-16 VITALS — BP 150/78 | HR 86 | Temp 98.6°F | Ht 74.0 in | Wt 294.4 lb

## 2016-03-16 DIAGNOSIS — E6609 Other obesity due to excess calories: Secondary | ICD-10-CM

## 2016-03-16 DIAGNOSIS — G4733 Obstructive sleep apnea (adult) (pediatric): Secondary | ICD-10-CM | POA: Diagnosis not present

## 2016-03-16 DIAGNOSIS — I10 Essential (primary) hypertension: Secondary | ICD-10-CM

## 2016-03-16 MED ORDER — FLUOXETINE HCL 40 MG PO CAPS: 40.0000 mg | ORAL_CAPSULE | Freq: Every day | ORAL | 3 refills | Status: DC

## 2016-03-16 NOTE — Patient Instructions (Addendum)
Limit your sodium (Salt) intake  Please check your blood pressure on a regular basis.  If it is consistently greater than 150/90, please make an office appointment.  You need to lose weight.  Consider a lower calorie diet and regular exercise.   DASH Eating Plan DASH stands for "Dietary Approaches to Stop Hypertension." The DASH eating plan is a healthy eating plan that has been shown to reduce high blood pressure (hypertension). Additional health benefits may include reducing the risk of type 2 diabetes mellitus, heart disease, and stroke. The DASH eating plan may also help with weight loss. What do I need to know about the DASH eating plan? For the DASH eating plan, you will follow these general guidelines:  Choose foods with less than 150 milligrams of sodium per serving (as listed on the food label).  Use salt-free seasonings or herbs instead of table salt or sea salt.  Check with your health care provider or pharmacist before using salt substitutes.  Eat lower-sodium products. These are often labeled as "low-sodium" or "no salt added."  Eat fresh foods. Avoid eating a lot of canned foods.  Eat more vegetables, fruits, and low-fat dairy products.  Choose whole grains. Look for the word "whole" as the first word in the ingredient list.  Choose fish and skinless chicken or [place] more often than red meat. Limit fish, poultry, and meat to 6 oz (170 g) each day.  Limit sweets, desserts, sugars, and sugary drinks.  Choose heart-healthy fats.  Eat more home-cooked food and less restaurant, buffet, and fast food.  Limit fried foods.  Do not fry foods. Cook foods using methods such as baking, boiling, grilling, and broiling instead.  When eating at a restaurant, ask that your food be prepared with less salt, or no salt if possible. What foods can I eat? Seek help from a dietitian for individual calorie needs. Grains  Whole grain or whole wheat bread. Brown rice. Whole grain or  whole wheat pasta. Quinoa, bulgur, and whole grain cereals. Low-sodium cereals. Corn or whole wheat flour tortillas. Whole grain cornbread. Whole grain crackers. Low-sodium crackers. Vegetables  Fresh or frozen vegetables (raw, steamed, roasted, or grilled). Low-sodium or reduced-sodium tomato and vegetable juices. Low-sodium or reduced-sodium tomato sauce and paste. Low-sodium or reduced-sodium canned vegetables. Fruits  All fresh, canned (in natural juice), or frozen fruits. Meat and Other Protein Products  Ground beef (85% or leaner), grass-fed beef, or beef trimmed of fat. Skinless chicken or [place]. Ground chicken or [place]. Pork trimmed of fat. All fish and seafood. Eggs. Dried beans, peas, or lentils. Unsalted nuts and seeds. Unsalted canned beans. Dairy  Low-fat dairy products, such as skim or 1% milk, 2% or reduced-fat cheeses, low-fat ricotta or cottage cheese, or plain low-fat yogurt. Low-sodium or reduced-sodium cheeses. Fats and Oils  Tub margarines without trans fats. Light or reduced-fat mayonnaise and salad dressings (reduced sodium). Avocado. Safflower, olive, or canola oils. Natural peanut or almond butter. Other  Unsalted popcorn and pretzels. The items listed above may not be a complete list of recommended foods or beverages. Contact your dietitian for more options.  What foods are not recommended? Grains  White bread. White pasta. White rice. Refined cornbread. Bagels and croissants. Crackers that contain trans fat. Vegetables  Creamed or fried vegetables. Vegetables in a cheese sauce. Regular canned vegetables. Regular canned tomato sauce and paste. Regular tomato and vegetable juices. Fruits  Canned fruit in light or heavy syrup. Fruit juice. Meat and Other Protein Products  Fatty cuts of meat. Ribs, chicken wings, bacon, sausage, bologna, salami, chitterlings, fatback, hot dogs, bratwurst, and packaged luncheon meats. Salted nuts and seeds. Canned beans with salt. Dairy   Whole or 2% milk, cream, half-and-half, and cream cheese. Whole-fat or sweetened yogurt. Full-fat cheeses or blue cheese. Nondairy creamers and whipped toppings. Processed cheese, cheese spreads, or cheese curds. Condiments  Onion and garlic salt, seasoned salt, table salt, and sea salt. Canned and packaged gravies. Worcestershire sauce. Tartar sauce. Barbecue sauce. Teriyaki sauce. Soy sauce, including reduced sodium. Steak sauce. Fish sauce. Oyster sauce. Cocktail sauce. Horseradish. Ketchup and mustard. Meat flavorings and tenderizers. Bouillon cubes. Hot sauce. Tabasco sauce. Marinades. Taco seasonings. Relishes. Fats and Oils  Butter, stick margarine, lard, shortening, ghee, and bacon fat. Coconut, palm kernel, or palm oils. Regular salad dressings. Other  Pickles and olives. Salted popcorn and pretzels. The items listed above may not be a complete list of foods and beverages to avoid. Contact your dietitian for more information.  Where can I find more information? National Heart, Lung, and Blood Institute: Cable[employer].itwww.nhlbi.nih.gov/health/health-topics/topics/dash/ This information is not intended to replace advice given to you by your health care provider. Make sure you discuss any questions you have with your health care provider. Document Released: 01/12/2011 Document Revised: 07/01/2015 Document Reviewed: 11/27/2012 Elsevier Interactive Patient Education  2017 [employer]

## 2016-03-16 NOTE — Progress Notes (Signed)
Subjective:    Patient ID: Travis King, male    DOB: [birth year], 46 y.o.   MRN: [id number]  HPI  46 year old patient who is seen today in follow-up.  He has not been seen in some time.  Medical illnesses include a history of hypertension in the past.  This has normalized with weight loss.  Proximal one year ago his weight was down to 245 range.  His weight has peaked at 308. Generally doing well today.  He does have a history of anxiety/panic disorder and had done markedly well on Prozac.  He has been off this medication for a couple weeks and has not done nearly as well.  This basically is what prompted his visit today.  Past Medical History:  Diagnosis Date  . Anxiety   . Hypertension      Social History   Social History  . Marital status: Single    Spouse name: N/A  . Number of children: N/A  . Years of education: N/A   Occupational History  . Not on file.   Social History Main Topics  . Smoking status: Current Some Day Smoker    Types: Cigars  . Smokeless tobacco: Never Used  . Alcohol use Yes  . Drug use: No  . Sexual activity: Not on file   Other Topics Concern  . Not on file   Social History Narrative  . No narrative on file    No past surgical history on file.  Family History  Problem Relation Age of Onset  . Heart attack Mother   . Hypertension Mother   . Diabetes Mother   . Dementia Father     No Known Allergies  Current Outpatient Prescriptions on File Prior to Visit  Medication Sig Dispense Refill  . Multiple Vitamins-Minerals (MULTIVITAMIN WITH MINERALS) tablet Take 1 tablet by mouth daily.    . sildenafil (VIAGRA) 100 MG tablet Take 1 tablet (100 mg total) by mouth daily as needed for erectile dysfunction. 3 tablet 11  . sodium chloride (OCEAN) 0.65 % SOLN nasal spray Place 2 sprays into both nostrils as needed for congestion.     No current facility-administered medications on file prior to visit.     BP (!) 150/78 (BP Location: Left  Arm, Patient Position: Sitting, Cuff Size: Normal)   Pulse 86   Temp 98.6 F (37 C) (Oral)   Ht 6\' 2"  (1.88 m)   Wt 294 lb 6.4 oz (133.5 kg)   SpO2 98%   BMI 37.80 kg/m     Review of Systems  Constitutional: Positive for unexpected weight change. Negative for appetite change, chills, fatigue and fever.  HENT: Negative for congestion, dental problem, ear pain, hearing loss, sore throat, tinnitus, trouble swallowing and voice change.   Eyes: Negative for pain, discharge and visual disturbance.  Respiratory: Negative for cough, chest tightness, wheezing and stridor.   Cardiovascular: Negative for chest pain, palpitations and leg swelling.  Gastrointestinal: Negative for abdominal distention, abdominal pain, blood in stool, constipation, diarrhea, nausea and vomiting.  Genitourinary: Negative for difficulty urinating, discharge, flank pain, genital sores, hematuria and urgency.  Musculoskeletal: Negative for arthralgias, back pain, gait problem, joint swelling, myalgias and neck stiffness.  Skin: Negative for rash.  Neurological: Negative for dizziness, syncope, speech difficulty, weakness, numbness and headaches.  Hematological: Negative for adenopathy. Does not bruise/bleed easily.  Psychiatric/Behavioral: Negative for behavioral problems and dysphoric mood. The patient is nervous/anxious.        Objective:   Physical Exam  Constitutional: He is oriented to person, place, and time. He appears well-developed.  Weight 294 Pressure 150 over 80  HENT:  Head: Normocephalic.  Right Ear: External ear normal.  Left Ear: External ear normal.  Eyes: Conjunctivae and EOM are normal.  Neck: Normal range of motion.  Cardiovascular: Normal rate and normal heart sounds.   Pulmonary/Chest: Breath sounds normal.  Abdominal: Bowel sounds are normal.  obese  Musculoskeletal: Normal range of motion. He exhibits no edema or tenderness.  Neurological: He is alert and oriented to person, place,  and time.  Psychiatric: He has a normal mood and affect. His behavior is normal.          Assessment & Plan:   Anxiety/panic disorder.  This has done extremely well on Prozac.  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  Weight loss encouraged History of hypertension.  Blood pressure borderline high today.  We'll place on a DASH and continue to monitor closely  Schedule CPX  Rogelia BogaKWIATKOWSKI,PETER FRANK

## 2016-03-16 NOTE — Progress Notes (Signed)
Pre visit review using our clinic review tool, if applicable. No additional management support is needed unless otherwise documented below in the visit note. 

## 2016-10-26 ENCOUNTER — Encounter: Payer: Self-pay | Admitting: Internal Medicine

## 2016-11-30 ENCOUNTER — Ambulatory Visit: Payer: PRIVATE HEALTH INSURANCE | Admitting: Internal Medicine

## 2016-11-30 DIAGNOSIS — Z0289 Encounter for other administrative examinations: Secondary | ICD-10-CM

## 2017-04-21 ENCOUNTER — Other Ambulatory Visit: Payer: Self-pay | Admitting: Internal Medicine

## 2017-06-22 ENCOUNTER — Other Ambulatory Visit: Payer: Self-pay | Admitting: Internal Medicine

## 2017-07-25 ENCOUNTER — Other Ambulatory Visit: Payer: Self-pay | Admitting: Internal Medicine

## 2017-08-28 ENCOUNTER — Other Ambulatory Visit: Payer: Self-pay | Admitting: Internal Medicine

## 2017-08-28 NOTE — Telephone Encounter (Signed)
Patient has not been seen since 03/16/16 Last fill 07/25/17 Ok to fill?

## 2018-01-27 ENCOUNTER — Other Ambulatory Visit: Payer: Self-pay | Admitting: Internal Medicine

## 2018-02-04 ENCOUNTER — Other Ambulatory Visit: Payer: Self-pay | Admitting: Internal Medicine

## 2018-02-04 NOTE — Telephone Encounter (Signed)
Copied from CRM [id number]. Topic: Quick Communication - Rx Refill/Question >> Feb 04, 2018  2:46 PM Mcneil, Ja-Kwan wrote: Medication: FLUoxetine (PROZAC) 40 MG capsule  Has the patient contacted their pharmacy? yes   Preferred Pharmacy (with phone number or street name): [id number] - [place], [place] - [address] AT [facility] [address] [phone] (Phone) [phone])  Agent: Please be advised that RX refills may take up to 3 business days. We ask that you follow-up with your pharmacy.

## 2018-02-05 NOTE — Telephone Encounter (Signed)
Refill request for fluoxetine; last office visit 03/16/16 with Dr Lesia HausenKwiatowski, [facility]; no upcoming visits  Noted; left message on voicemail [phone]; will route to office for final dispostion; previous refill request dated 01/27/18 still pending. Requested medication (s) are due for refill today: yes  Requested medication (s) are on the active medication list: yes  Last refill:  ?  Future visit scheduled: no  Notes to clinic:  Last office visit 03/16/16; previously seen by Dr Amador CunasKwiatkowski       Requested Prescriptions  Pending Prescriptions Disp Refills  . FLUoxetine (PROZAC) 40 MG capsule 30 capsule 0    Sig: Take 1 capsule (40 mg total) by mouth daily.     Psychiatry:  Antidepressants - SSRI Failed - 02/04/2018  6:53 PM      Failed - Valid encounter within last 6 months    Recent Outpatient Visits          1 year ago Obstructive sleep apnea   [facility], Janett LabellaPeter F, MD   5 years ago Obstructive sleep apnea   [facility], Janett LabellaPeter F, MD   7 years ago [facility]XIETY DISORDER   [facility], Janett LabellaPeter F, MD

## 2018-02-05 NOTE — Telephone Encounter (Signed)
Patient has not been seen since 03/16/16 No TOC appointment scheduled. Please advise

## 2018-02-07 ENCOUNTER — Other Ambulatory Visit: Payer: Self-pay | Admitting: Internal Medicine

## 2018-02-07 ENCOUNTER — Ambulatory Visit: Payer: Self-pay

## 2018-02-07 MED ORDER — FLUOXETINE HCL 40 MG PO CAPS: 40.0000 mg | ORAL_CAPSULE | Freq: Every day | ORAL | 0 refills | Status: DC

## 2018-02-07 NOTE — Telephone Encounter (Signed)
Copied from CRM [id number]. Topic: Quick Communication - Rx Refill/Question >> Feb 07, 2018  1:38 PM Fanny Bien wrote: Medication:FLUoxetine (PROZAC) 40 MG capsule [[id number]]   Has the patient contacted their pharmacy? no Preferred Pharmacy (with phone number or street name): [facility] #[id number] - [place], [place] - [address] AT [facility] [address] & [address] [phone] (Phone) [phone] (Fax)    Agent: Please be advised that RX refills may take up to 3 business days. We ask that you follow-up with your pharmacy.

## 2018-02-07 NOTE — Telephone Encounter (Signed)
Requested medication (s) are due for refill today: Yes  Requested medication (s) are on the active medication list: Yes  Last refill:  08/28/17  Future visit scheduled: Yes  Notes to clinic:  Unable to refill, last refilled by Dr. Kirtland Bouchard.     Requested Prescriptions  Pending Prescriptions Disp Refills   FLUoxetine (PROZAC) 40 MG capsule 30 capsule 0    Sig: Take 1 capsule (40 mg total) by mouth daily.     Psychiatry:  Antidepressants - SSRI Failed - 02/07/2018  1:55 PM      Failed - Valid encounter within last 6 months    Recent Outpatient Visits          1 year ago Obstructive sleep apnea   [facility], Janett Labella, MD   5 years ago Obstructive sleep apnea   [facility], Janett Labella, MD   7 years ago ANXIETY DISORDER   [facility], Janett Labella, MD      Future Appointments            In 2 weeks Philip Aspen, Limmie Patricia, MD [facility] [place], [facility]

## 2018-02-07 NOTE — Telephone Encounter (Signed)
Patient called and says that he has been out of his Prozac for 3 days and wanted to know what symptoms to look out for when you stop the medication abruptly. I advised of the symptoms based on literature, he verbalized understanding. He says that he can tell he is not taking it, but nothing severe like the symptoms mentioned. I advised he could come in for a visit to request a refill based on his symptoms, but he will still need to establish with Dr. Ardyth Harps as scheduled on 02/22/18. He wanted to know what would the visit involve. I advised it would not involve a complete physical as that is what his appointment with Dr. Ardyth Harps will be, but it will be detailed enough to receive the medication refill. He says he doesn't know if he has enough money to come in and wanted to know the cost. I called the office and spoke to [place], [facility] who says to send this over and she will refill the medication. I advised the patient of the above from [place], he verbalized understanding.   Patient's Last OV: 03/16/16.  Pharmacy: [facility] #[id number] - [place], [place] - [address] AT [facility] [address] & [address] [phone] (Phone) [phone] (Fax)    Reason for Disposition . Caller has NON-URGENT medication question about med that PCP prescribed and triager unable to answer question  Protocols used: MEDICATION QUESTION CALL-A-AH

## 2018-02-07 NOTE — Telephone Encounter (Signed)
Rx sent 

## 2018-02-22 ENCOUNTER — Encounter: Payer: PRIVATE HEALTH INSURANCE | Admitting: Internal Medicine

## 2018-02-22 DIAGNOSIS — Z0289 Encounter for other administrative examinations: Secondary | ICD-10-CM

## 2018-03-09 ENCOUNTER — Other Ambulatory Visit: Payer: Self-pay | Admitting: Internal Medicine

## 2018-08-21 ENCOUNTER — Other Ambulatory Visit: Payer: Self-pay

## 2018-08-21 ENCOUNTER — Ambulatory Visit (INDEPENDENT_AMBULATORY_CARE_PROVIDER_SITE_OTHER): Payer: PRIVATE HEALTH INSURANCE | Admitting: Internal Medicine

## 2018-08-21 DIAGNOSIS — E6609 Other obesity due to excess calories: Secondary | ICD-10-CM

## 2018-08-21 DIAGNOSIS — G4733 Obstructive sleep apnea (adult) (pediatric): Secondary | ICD-10-CM

## 2018-08-21 DIAGNOSIS — I10 Essential (primary) hypertension: Secondary | ICD-10-CM

## 2018-08-21 DIAGNOSIS — F411 Generalized anxiety disorder: Secondary | ICD-10-CM

## 2018-08-21 MED ORDER — FLUOXETINE HCL 40 MG PO CAPS: 40.0000 mg | ORAL_CAPSULE | Freq: Every day | ORAL | 1 refills | Status: DC

## 2018-08-21 NOTE — Progress Notes (Signed)
Virtual Visit via Video Note  I connected with Travis King on 08/21/18 at 11:30 AM EDT by a video enabled telemedicine application and verified that I am speaking with the correct person using two identifiers.  Location patient: home Location provider: work office Persons participating in the virtual visit: patient, provider  I discussed the limitations of evaluation and management by telemedicine and the availability of in person appointments. The patient expressed understanding and agreed to proceed.   HPI: This is a scheduled visit for a transfer of care and for medication refills.  His past medical history is significant for:  1.  History of obstructive sleep apnea on CPAP.  He feels this is well compensated, does not have frequent nighttime awakenings and feels well rested when he wakes up in the morning.  2.  He has a history of anxiety/panic disorder and had done very well while on Prozac 40 mg.  He discontinued this medication in January as he had not followed up for refills and has noted a significant increase in anxiety since.  This is the main reason for his visit today.  3.  History of elevated blood pressure without diagnosis of hypertension, he does not check his blood pressure regularly but when it was most recently checked it was 120/85, he has never been on blood pressure medications before.  4.  Obesity he states he is currently weighing 274 pounds.    ROS: Constitutional: Denies fever, chills, diaphoresis, appetite change and fatigue.  HEENT: Denies photophobia, eye pain, redness, hearing loss, ear pain, congestion, sore throat, rhinorrhea, sneezing, mouth sores, trouble swallowing, neck pain, neck stiffness and tinnitus.   Respiratory: Denies SOB, DOE, cough, chest tightness,  and wheezing.   Cardiovascular: Denies chest pain, palpitations and leg swelling.  Gastrointestinal: Denies nausea, vomiting, abdominal pain, diarrhea, constipation, blood in stool and  abdominal distention.  Genitourinary: Denies dysuria, urgency, frequency, hematuria, flank pain and difficulty urinating.  Endocrine: Denies: hot or cold intolerance, sweats, changes in hair or nails, polyuria, polydipsia. Musculoskeletal: Denies myalgias, back pain, joint swelling, arthralgias and gait problem.  Skin: Denies pallor, rash and wound.  Neurological: Denies dizziness, seizures, syncope, weakness, light-headedness, numbness and headaches.  Hematological: Denies adenopathy. Easy bruising, personal or family bleeding history  Psychiatric/Behavioral: Denies suicidal ideation, mood changes, confusion, nervousness, sleep disturbance and agitation   Past Medical History:  Diagnosis Date  . Anxiety   . Hypertension     No past surgical history on file.  Family History  Problem Relation Age of Onset  . Heart attack Mother   . Hypertension Mother   . Diabetes Mother   . Dementia Father     SOCIAL HX:   reports that he has been smoking cigars. He has never used smokeless tobacco. He reports current alcohol use. He reports that he does not use drugs.   Current Outpatient Medications:  .  FLUoxetine (PROZAC) 40 MG capsule, Take 1 capsule (40 mg total) by mouth daily., Disp: 90 capsule, Rfl: 1 .  Multiple Vitamins-Minerals (MULTIVITAMIN WITH MINERALS) tablet, Take 1 tablet by mouth daily., Disp: , Rfl:  .  sildenafil (VIAGRA) 100 MG tablet, Take 1 tablet (100 mg total) by mouth daily as needed for erectile dysfunction., Disp: 3 tablet, Rfl: 11 .  sodium chloride (OCEAN) 0.65 % SOLN nasal spray, Place 2 sprays into both nostrils as needed for congestion., Disp: , Rfl:   EXAM:   VITALS per patient if applicable: None reported  GENERAL: alert, oriented, appears  well and in no acute distress  HEENT: atraumatic, conjunttiva clear, no obvious abnormalities on inspection of external nose and ears  NECK: normal movements of the head and neck  LUNGS: on inspection no signs of  respiratory distress, breathing rate appears normal, no obvious gross increased work of breathing, gasping or wheezing  CV: no obvious cyanosis  MS: moves all visible extremities without noticeable abnormality  PSYCH/NEURO: pleasant and cooperative, no obvious depression or anxiety, speech and thought processing grossly intact  ASSESSMENT AND PLAN:   Anxiety state  -We will refill his fluoxetine 40 mg today. -Follow-up in 3 months or sooner if needed.  Essential hypertension -Has not been formally diagnosed as hypertensive yet per his report, has never been on medications. -Continue to follow-up with serial BP measurements.  Obstructive sleep apnea -Well-controlled on nightly CPAP  Exogenous obesity -Discussed healthy lifestyle, including increased physical activity and better food choices to promote weight loss.    I discussed the assessment and treatment plan with the patient. The patient was provided an opportunity to ask questions and all were answered. The patient agreed with the plan and demonstrated an understanding of the instructions.   The patient was advised to call back or seek an in-person evaluation if the symptoms worsen or if the condition fails to improve as anticipated.    Lelon Frohlich, MD  [facility]

## 2018-12-06 ENCOUNTER — Ambulatory Visit: Payer: Self-pay | Admitting: Internal Medicine

## 2019-01-07 ENCOUNTER — Other Ambulatory Visit: Payer: Self-pay

## 2019-01-07 ENCOUNTER — Emergency Department (HOSPITAL_COMMUNITY)
Admission: EM | Admit: 2019-01-07 | Discharge: 2019-01-08 | Disposition: A | Discharge status: Home / Self Care | Payer: Self-pay | Attending: Emergency Medicine | Admitting: Emergency Medicine

## 2019-01-07 ENCOUNTER — Encounter (HOSPITAL_COMMUNITY): Payer: Self-pay

## 2019-01-07 DIAGNOSIS — N200 Calculus of kidney: Secondary | ICD-10-CM | POA: Insufficient documentation

## 2019-01-07 DIAGNOSIS — Z79899 Other long term (current) drug therapy: Secondary | ICD-10-CM | POA: Insufficient documentation

## 2019-01-07 DIAGNOSIS — F17290 Nicotine dependence, other tobacco product, uncomplicated: Secondary | ICD-10-CM | POA: Insufficient documentation

## 2019-01-07 DIAGNOSIS — I10 Essential (primary) hypertension: Secondary | ICD-10-CM | POA: Insufficient documentation

## 2019-01-07 LAB — CBC
HCT: 49 % (ref 39.0–52.0)
Hemoglobin: 16.5 g/dL (ref 13.0–17.0)
MCH: 28.5 pg (ref 26.0–34.0)
MCHC: 33.7 g/dL (ref 30.0–36.0)
MCV: 84.8 fL (ref 80.0–100.0)
Platelets: 303 10*3/uL (ref 150–400)
RBC: 5.78 MIL/uL (ref 4.22–5.81)
RDW: 12.3 % (ref 11.5–15.5)
WBC: 12.6 10*3/uL — ABNORMAL HIGH (ref 4.0–10.5)
nRBC: 0 % (ref 0.0–0.2)

## 2019-01-07 LAB — COMPREHENSIVE METABOLIC PANEL
ALT: 52 U/L — ABNORMAL HIGH (ref 0–44)
AST: 47 U/L — ABNORMAL HIGH (ref 15–41)
Albumin: 4.4 g/dL (ref 3.5–5.0)
Alkaline Phosphatase: 82 U/L (ref 38–126)
Anion gap: 14 (ref 5–15)
BUN: 16 mg/dL (ref 6–20)
CO2: 21 mmol/L — ABNORMAL LOW (ref 22–32)
Calcium: 9.3 mg/dL (ref 8.9–10.3)
Chloride: 102 mmol/L (ref 98–111)
Creatinine, Ser: 1.16 mg/dL (ref 0.61–1.24)
GFR calc Af Amer: >60 mL/min (ref >60)
GFR calc non Af Amer: >60 mL/min (ref >60)
Glucose, Bld: 281 mg/dL — ABNORMAL HIGH (ref 70–99)
Potassium: 4.5 mmol/L (ref 3.5–5.1)
Sodium: 137 mmol/L (ref 135–145)
Total Bilirubin: 1.6 mg/dL — ABNORMAL HIGH (ref 0.3–1.2)
Total Protein: 7.5 g/dL (ref 6.5–8.1)

## 2019-01-07 LAB — URINALYSIS, ROUTINE W REFLEX MICROSCOPIC
Bacteria, UA: NONE SEEN
Bilirubin Urine: NEGATIVE
Glucose, UA: >=500 mg/dL — AB
Ketones, ur: 20 mg/dL — AB
Leukocytes,Ua: NEGATIVE
Nitrite: NEGATIVE
Protein, ur: NEGATIVE mg/dL
Specific Gravity, Urine: 1.021 (ref 1.005–1.030)
pH: 6 (ref 5.0–8.0)

## 2019-01-07 LAB — LIPASE, BLOOD: Lipase: 22 U/L (ref 11–51)

## 2019-01-07 MED ORDER — SODIUM CHLORIDE 0.9% FLUSH: 3.0000 mL | Freq: Once | INTRAVENOUS | Status: DC

## 2019-01-07 NOTE — ED Triage Notes (Signed)
Pt states that he was out at dinner and began to have L sided flank pain and several episode of vomiting, pain radiates to back, denies hematuria.

## 2019-01-08 ENCOUNTER — Emergency Department (HOSPITAL_COMMUNITY): Payer: Self-pay

## 2019-01-08 MED ORDER — KETOROLAC TROMETHAMINE 60 MG/2ML IM SOLN
60.0000 mg | Freq: Once | INTRAMUSCULAR | Status: AC
  Administered 2019-01-08: 60 mg via INTRAMUSCULAR
  Filled 2019-01-08: qty 2

## 2019-01-08 MED ORDER — ONDANSETRON 4 MG PO TBDP: 4.0000 mg | ORAL_TABLET | Freq: Three times a day (TID) | ORAL | 0 refills | Status: DC | PRN

## 2019-01-08 MED ORDER — TAMSULOSIN HCL 0.4 MG PO CAPS: 0.4000 mg | ORAL_CAPSULE | Freq: Every day | ORAL | 0 refills | Status: AC

## 2019-01-08 MED ORDER — HYDROCODONE-ACETAMINOPHEN 5-325 MG PO TABS: 1.0000 | ORAL_TABLET | Freq: Four times a day (QID) | ORAL | 0 refills | Status: DC | PRN

## 2019-01-08 NOTE — ED Provider Notes (Signed)
[facility] EM[facility]GENCY DEPARTMENT Provider Note   CSN: [id number] Arrival date & time: 01/07/19  2150     History   Chief Complaint Chief Complaint  Patient presents with  . Flank Pain    HPI Travis King is a 48 y.o. male has a history of anxiety, hypertension who presents for evaluation of left-sided flank pain that began last night after dinner.  He also reports some associated episodes of vomiting.  He reports that he had been in his normal state of health prior to onset of symptoms.  He states last night, he started noticing some mild pain in his left flank/back area.  He states that it became more severe and started radiating to the front of his abdomen.  He had several episodes of vomiting prompting ED visit.  Patient reports that pain has improved significantly.  He states that he has not had any dysuria, hematuria.  He denies any fevers, chest pain, difficulty breathing, swelling/pain of his penis or testicles.     The history is provided by the patient.    Past Medical History:  Diagnosis Date  . Anxiety   . Hypertension     Patient Active Problem List   Diagnosis Date Noted  . Essential hypertension 03/16/2016  . Obstructive sleep apnea 04/11/2012  . 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  . Anxiety state 02/28/2007    History reviewed. No pertinent surgical history.      Home Medications    Prior to Admission medications   Medication Sig Start Date End Date Taking? Authorizing Provider  FLUoxetine (PROZAC) 40 MG capsule Take 1 capsule (40 mg total) by mouth daily. 08/21/18   Isaac Bliss, Rayford Halsted, MD  HYDROcodone-acetaminophen (NORCO/VICODIN) 5-325 MG tablet Take 1-2 tablets by mouth every 6 (six) hours as needed. 01/08/19   Volanda Napoleon, PA-C  Multiple Vitamins-Minerals (MULTIVITAMIN WITH MINERALS) tablet Take 1 tablet by mouth daily.    [provider]  ondansetron (ZOFRAN ODT) 4 MG disintegrating tablet Take 1 tablet (4 mg  total) by mouth every 8 (eight) hours as needed for nausea or vomiting. 01/08/19   Volanda Napoleon, PA-C  sildenafil (VIAGRA) 100 MG tablet Take 1 tablet (100 mg total) by mouth daily as needed for erectile dysfunction. 04/11/12   Marletta Lor, MD  sodium chloride (OCEAN) 0.65 % SOLN nasal spray Place 2 sprays into both nostrils as needed for congestion.    [provider]  tamsulosin (FLOMAX) 0.4 MG CAPS capsule Take 1 capsule (0.4 mg total) by mouth daily for 7 days. 01/08/19 01/15/19  Volanda Napoleon, PA-C    Family History Family History  Problem Relation Age of Onset  . Heart attack Mother   . Hypertension Mother   . Diabetes Mother   . Dementia Father     Social History Social History   Tobacco Use  . Smoking status: Current Some Day Smoker    Types: Cigars  . Smokeless tobacco: Never Used  Substance Use Topics  . Alcohol use: Yes  . Drug use: No     Allergies   Patient has no known allergies.   Review of Systems Review of Systems  Constitutional: Negative for fever.  Gastrointestinal: Positive for abdominal pain, nausea and vomiting.  Genitourinary: Positive for flank pain. Negative for dysuria, penile swelling, scrotal swelling and testicular pain.  All other systems reviewed and are negative.    Physical Exam Updated Vital Signs BP (!) 150/97 (BP Location: Left Arm)   Pulse  85   Temp 99 F (37.2 C) (Oral)   Resp 20   Ht  (1.88 m)   Wt 124.7 kg   SpO2 98%   BMI 35.31 kg/m   Physical Exam Vitals signs and nursing note reviewed.  Constitutional:      Appearance: Normal appearance. He is well-developed.     Comments: Sitting comfortably on examination table  HENT:     Head: Normocephalic and atraumatic.  Eyes:     General: Lids are normal.     Conjunctiva/sclera: Conjunctivae normal.     Pupils: Pupils are equal, round, and reactive to light.  Neck:     Musculoskeletal: Full passive range of motion without pain.   Cardiovascular:     Rate and Rhythm: Normal rate and regular rhythm.     Pulses: Normal pulses.     Heart sounds: Normal heart sounds. No murmur. No friction rub. No gallop.   Pulmonary:     Effort: Pulmonary effort is normal.     Breath sounds: Normal breath sounds.     Comments: Lungs clear to auscultation bilaterally.  Symmetric chest rise.  No wheezing, rales, rhonchi. Abdominal:     Palpations: Abdomen is soft. Abdomen is not rigid.     Tenderness: There is no abdominal tenderness. There is left CVA tenderness. There is no guarding.     Comments: Left-sided CVA tenderness noted.  No right-sided CVA tenderness.  Abdomen soft, nondistended.  No tenderness palpation.  Musculoskeletal: Normal range of motion.  Skin:    General: Skin is warm and dry.     Capillary Refill: Capillary refill takes less than 2 seconds.  Neurological:     Mental Status: He is alert and oriented to person, place, and time.  Psychiatric:        Speech: Speech normal.      ED Treatments / Results  Labs (all labs ordered are listed, but only abnormal results are displayed) Labs Reviewed  COMPREHENSIVE METABOLIC PANEL - Abnormal; Notable for the following components:      Result Value   CO2 21 (*)    Glucose, Bld 281 (*)    AST 47 (*)    ALT 52 (*)    Total Bilirubin 1.6 (*)    All other components within normal limits  CBC - Abnormal; Notable for the following components:   WBC 12.6 (*)    All other components within normal limits  URINALYSIS, ROUTINE W REFLEX MICROSCOPIC - Abnormal; Notable for the following components:   Glucose, UA >=500 (*)    Hgb urine dipstick SMALL (*)    Ketones, ur 20 (*)    All other components within normal limits  LIPASE, BLOOD    EKG None  Radiology Ct Renal Stone Study  Result Date: 01/08/2019 CLINICAL DATA:  Left-sided flank pain and vomiting EXAM: CT ABDOMEN AND PELVIS WITHOUT CONTRAST TECHNIQUE: Multidetector CT imaging of the abdomen and pelvis was  performed following the standard protocol without oral or IV contrast. COMPARISON:  None. FINDINGS: Lower chest: No edema or consolidation. On axial slice 8 series 5, there is a 2 mm nodular opacity in the lateral segment of the right lower lobe. On axial slice 12 series 5, there is a 3 mm nodular opacity in the anterior segment of the left lower lobe. Hepatobiliary: There is hepatic steatosis. No focal liver lesions are evident on this noncontrast enhanced study. Gallbladder wall is not appreciably thickened. There is no biliary duct dilatation. Pancreas: No pancreatic  mass or inflammatory focus evident. Spleen: Spleen measures 13.6 x 13.4 x 8.5 cm with a measured splenic volume of 775 cubic cm. No focal splenic lesions are evident. Right adrenal appears normal. There is a mass arising from the left adrenal measuring 1.6 x 1.4 cm which has attenuation values placing this lesion in an indeterminate category with respect to etiology. The left kidney is edematous. There is no renal mass on either side. There is moderate hydronephrosis on the left. There is no hydronephrosis on the right. There is a 4 x 3 mm calculus in the lower pole of the left kidney. No intrarenal calculus evident on the right. There is a calculus at the left ureterovesical junction measuring 5 x 4 mm with diffuse ureterectasis on the left. No other ureteral calculi are evident on either side. Urinary bladder is midline. Urinary bladder wall is thickened. Stomach/Bowel: There is no appreciable bowel wall or mesenteric thickening. Terminal ileum appears normal. No evident bowel obstruction. There is no free air or portal venous air. Vascular/Lymphatic: There is no abdominal aortic aneurysm. There is slight calcification in the right common iliac artery. No adenopathy is evident in the abdomen or pelvis. Reproductive: Prostate and seminal vesicles appear normal in size and contour. There is no evident pelvic mass. Other: Appendix appears normal.  There is no abscess or ascites in the abdomen or pelvis. Musculoskeletal: There is degenerative change in the lower thoracic and lumbar spine regions. There are no blastic or lytic bone lesions. There is no intramuscular or abdominal wall lesion. IMPRESSION: 1. There is a 5 x 4 mm calculus at the left ureterovesical junction with moderate hydronephrosis and ureterectasis on the left. Left kidney is edematous. 2. Nonobstructing calculus in the lower pole left kidney measuring 4 x 3 mm. 3. Urinary bladder wall thickening consistent with a degree of cystitis. 4. Splenic enlargement without focal splenic lesion evident on this noncontrast enhanced study. 5. 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 Per consensus guidelines, a follow-up CT of the adrenals in 1 year advised to assess for stability. 6.  Hepatic steatosis. 7. Small nodular opacities in the lung bases, largest measuring 4 mm. No follow-up needed if patient is low-risk (and has no known or suspected primary neoplasm). Non-contrast chest CT can be considered in 12 months if patient is high-risk. This recommendation follows the consensus statement: Guidelines for Management of Incidental Pulmonary Nodules Detected on CT Images: From the Fleischner Society 2017; Radiology 2017; 284:228-243. Electronically Signed   By: Bretta Bang III M.D.   On: 01/08/2019 07:44    Procedures Procedures (including critical care time)  Medications Ordered in ED Medications  sodium chloride flush (NS) 0.9 % injection 3 mL (3 mLs Intravenous Not Given 01/08/19 0901)  ketorolac (TORADOL) injection 60 mg (60 mg Intramuscular Given 01/08/19 0829)     Initial Impression / Assessment and Plan / ED Course  I have reviewed the triage vital signs and the nursing notes.  Pertinent labs & imaging results that were available during my care of the patient were reviewed by me  and considered in my medical decision making (see chart for details).        48 year old male who presents for evaluation of left flank pain that began yesterday after eating dinner.  Associated with nausea/vomiting.  No fevers, chest pain, difficulty breathing.  Initially arrival, he is afebrile, nontoxic-appearing.  Vital signs  are stable.  On exam, he has left-sided CVA tenderness.  Consider kidney stone.  Labs and imaging ordered at triage.  CMP shows glucose of 281, bicarb 21.  BUN and creatinine within normal limits.  UA shows no evidence of infectious etiology.  CBC shows slight leukocytosis of 12.6.  CT renal study shows a 5 mm left UVJ stone with moderate hydronephrosis.  Left kidney is slightly edematous.  He has a nonobstructing stone in the pole of the left kidney.  There is also mention of small nodular opacities in the lung bases.  Discussed results with patient.  Patient reports improvement in pain.  He is hemodynamically stable for discharge at this time.  Will give patient short course of pain medication.  Patient to follow-up outpatient urology. At this time, patient exhibits no emergent life-threatening condition that require further evaluation in ED or admission. Patient had ample opportunity for questions and discussion. All patient's questions were answered with full understanding. Strict return precautions discussed. Patient expresses understanding and agreement to plan.   Portions of this note were generated with Scientist, clinical (histocompatibility and immunogenetics)Dragon dictation software. Dictation errors may occur despite best attempts at proofreading.   Final Clinical Impressions(s) / ED Diagnoses   Final diagnoses:  Kidney stone    ED Discharge Orders         Ordered    HYDROcodone-acetaminophen (NORCO/VICODIN) 5-325 MG tablet  Every 6 hours PRN     01/08/19 0830    ondansetron (ZOFRAN ODT) 4 MG disintegrating tablet  Every 8 hours PRN     01/08/19 0830    tamsulosin (FLOMAX) 0.4 MG CAPS capsule  Daily      01/08/19 0830           Rosana HoesLayden, Billie Trager A, PA-C 01/08/19 1058    Gerhard MunchLockwood, Robert, MD 01/08/19 1413

## 2019-01-08 NOTE — ED Notes (Signed)
Pt did not respond when called for vitals check 

## 2019-01-08 NOTE — Discharge Instructions (Signed)
You can take Tylenol or Ibuprofen as directed for pain. You can alternate Tylenol and Ibuprofen every 4 hours. If you take Tylenol at 1pm, then you can take Ibuprofen at 5pm. Then you can take Tylenol again at 9pm.   Take pain medications as directed for break through pain. Do not drive or operate machinery while taking this medication.   Take Flomax as directed.  Strain your urine.  Follow-up with urology as directed.  Return the emergency department for any worsening pain, fever, vomiting, inability to eat or drink.  As we discussed today, your blood sugar slightly elevated.  This needs to be rechecked by primary care doctor.  Additionally, on your CT scan, there is mention of pulmonary nodule of the base of the lung.  This is often benign but needs follow-up in about a year for reevaluation.

## 2019-04-20 ENCOUNTER — Other Ambulatory Visit: Payer: Self-pay | Admitting: Internal Medicine

## 2019-04-20 DIAGNOSIS — F411 Generalized anxiety disorder: Secondary | ICD-10-CM

## 2019-07-18 ENCOUNTER — Other Ambulatory Visit: Payer: Self-pay | Admitting: Internal Medicine

## 2019-07-18 DIAGNOSIS — F411 Generalized anxiety disorder: Secondary | ICD-10-CM

## 2020-04-23 ENCOUNTER — Encounter: Payer: Self-pay | Admitting: Internal Medicine

## 2020-04-23 ENCOUNTER — Other Ambulatory Visit: Payer: Self-pay | Admitting: Internal Medicine

## 2020-04-23 ENCOUNTER — Other Ambulatory Visit: Payer: Self-pay

## 2020-04-23 ENCOUNTER — Ambulatory Visit (INDEPENDENT_AMBULATORY_CARE_PROVIDER_SITE_OTHER): Payer: Self-pay | Admitting: Internal Medicine

## 2020-04-23 VITALS — BP 120/88 | HR 84 | Temp 97.9°F | Ht 74.0 in | Wt 265.6 lb

## 2020-04-23 DIAGNOSIS — Z0000 Encounter for general adult medical examination without abnormal findings: Secondary | ICD-10-CM

## 2020-04-23 DIAGNOSIS — E559 Vitamin D deficiency, unspecified: Secondary | ICD-10-CM | POA: Insufficient documentation

## 2020-04-23 DIAGNOSIS — F411 Generalized anxiety disorder: Secondary | ICD-10-CM

## 2020-04-23 DIAGNOSIS — E6609 Other obesity due to excess calories: Secondary | ICD-10-CM

## 2020-04-23 DIAGNOSIS — E1169 Type 2 diabetes mellitus with other specified complication: Secondary | ICD-10-CM

## 2020-04-23 DIAGNOSIS — G4733 Obstructive sleep apnea (adult) (pediatric): Secondary | ICD-10-CM

## 2020-04-23 DIAGNOSIS — E785 Hyperlipidemia, unspecified: Secondary | ICD-10-CM

## 2020-04-23 DIAGNOSIS — Z1211 Encounter for screening for malignant neoplasm of colon: Secondary | ICD-10-CM

## 2020-04-23 DIAGNOSIS — E119 Type 2 diabetes mellitus without complications: Secondary | ICD-10-CM

## 2020-04-23 LAB — CBC WITH DIFFERENTIAL/PLATELET
Basophils Absolute: 0.1 10*3/uL (ref 0.0–0.1)
Basophils Relative: 1.2 % (ref 0.0–3.0)
Eosinophils Absolute: 0.2 10*3/uL (ref 0.0–0.7)
Eosinophils Relative: 2.1 % (ref 0.0–5.0)
HCT: 44.5 % (ref 39.0–52.0)
Hemoglobin: 15.4 g/dL (ref 13.0–17.0)
Lymphocytes Relative: 38.4 % (ref 12.0–46.0)
Lymphs Abs: 2.8 10*3/uL (ref 0.7–4.0)
MCHC: 34.6 g/dL (ref 30.0–36.0)
MCV: 82.6 fl (ref 78.0–100.0)
Monocytes Absolute: 0.4 10*3/uL (ref 0.1–1.0)
Monocytes Relative: 5.6 % (ref 3.0–12.0)
Neutro Abs: 3.9 10*3/uL (ref 1.4–7.7)
Neutrophils Relative %: 52.7 % (ref 43.0–77.0)
Platelets: 273 10*3/uL (ref 150.0–400.0)
RBC: 5.39 Mil/uL (ref 4.22–5.81)
RDW: 13 % (ref 11.5–15.5)
WBC: 7.4 10*3/uL (ref 4.0–10.5)

## 2020-04-23 LAB — LIPID PANEL
Cholesterol: 182 mg/dL (ref 0–200)
HDL: 34.7 mg/dL — ABNORMAL LOW (ref >39.00)
NonHDL: 147.26
Total CHOL/HDL Ratio: 5
Triglycerides: 224 mg/dL — ABNORMAL HIGH (ref 0.0–149.0)
VLDL: 44.8 mg/dL — ABNORMAL HIGH (ref 0.0–40.0)

## 2020-04-23 LAB — COMPREHENSIVE METABOLIC PANEL
ALT: 30 U/L (ref 0–53)
AST: 13 U/L (ref 0–37)
Albumin: 4.3 g/dL (ref 3.5–5.2)
Alkaline Phosphatase: 77 U/L (ref 39–117)
BUN: 17 mg/dL (ref 6–23)
CO2: 28 mEq/L (ref 19–32)
Calcium: 9.4 mg/dL (ref 8.4–10.5)
Chloride: 97 mEq/L (ref 96–112)
Creatinine, Ser: 0.89 mg/dL (ref 0.40–1.50)
GFR: 102.22 mL/min (ref >60.00)
Glucose, Bld: 237 mg/dL — ABNORMAL HIGH (ref 70–99)
Potassium: 3.7 mEq/L (ref 3.5–5.1)
Sodium: 136 mEq/L (ref 135–145)
Total Bilirubin: 0.9 mg/dL (ref 0.2–1.2)
Total Protein: 7.1 g/dL (ref 6.0–8.3)

## 2020-04-23 LAB — HEMOGLOBIN A1C: Hgb A1c MFr Bld: 10.3 % — ABNORMAL HIGH (ref 4.6–6.5)

## 2020-04-23 LAB — LDL CHOLESTEROL, DIRECT: Direct LDL: 116 mg/dL

## 2020-04-23 LAB — TSH: TSH: 2.63 u[IU]/mL (ref 0.35–4.50)

## 2020-04-23 LAB — VITAMIN B12: Vitamin B-12: 339 pg/mL (ref 211–911)

## 2020-04-23 LAB — VITAMIN D 25 HYDROXY (VIT D DEFICIENCY, FRACTURES): VITD: 14.55 ng/mL — ABNORMAL LOW (ref 30.00–100.00)

## 2020-04-23 MED ORDER — METFORMIN HCL 1000 MG PO TABS: 1000.0000 mg | ORAL_TABLET | Freq: Two times a day (BID) | ORAL | 1 refills | Status: DC

## 2020-04-23 MED ORDER — VITAMIN D (ERGOCALCIFEROL) 1.25 MG (50000 UNIT) PO CAPS: 50000.0000 [IU] | ORAL_CAPSULE | ORAL | 0 refills | Status: AC

## 2020-04-23 MED ORDER — ATORVASTATIN CALCIUM 40 MG PO TABS: 40.0000 mg | ORAL_TABLET | Freq: Every day | ORAL | 1 refills | Status: DC

## 2020-04-23 NOTE — Progress Notes (Signed)
Established Patient Office Visit     This visit occurred during the SARS-CoV-2 public health emergency.  Safety protocols were in place, including screening questions prior to the visit, additional usage of staff PPE, and extensive cleaning of exam room while observing appropriate contact time as indicated for disinfecting solutions.    CC/Reason for Visit: Annual preventive exam  HPI: Travis King is a 50 y.o. male who is coming in today for the above mentioned reasons. Past Medical History is significant for: Obstructive sleep apnea on nightly CPAP, generalized anxiety disorder on fluoxetine, obesity.  He has noticed some rapid weight loss nonintentional and increased urination.  He has family history of diabetes and is concerned about this.  He otherwise feels well and has no complaints.  He is in need of eye and dental exam, he does not exercise routinely.  All immunizations are up-to-date, he is overdue for his initial screening colonoscopy.   Past Medical/Surgical History: Past Medical History:  Diagnosis Date  . Anxiety   . Hypertension     No past surgical history on file.  Social History:  reports that he has been smoking cigars. He has never used smokeless tobacco. He reports current alcohol use. He reports that he does not use drugs.  Allergies: No Known Allergies  Family History:  Family History  Problem Relation Age of Onset  . Heart attack Mother   . Hypertension Mother   . Diabetes Mother   . Dementia Father      Current Outpatient Medications:  .  FLUoxetine (PROZAC) 40 MG capsule, TAKE 1 CAPSULE(40 MG) BY MOUTH DAILY, Disp: 90 capsule, Rfl: 0 .  Multiple Vitamins-Minerals (MULTIVITAMIN WITH MINERALS) tablet, Take 1 tablet by mouth daily., Disp: , Rfl:  .  sildenafil (VIAGRA) 100 MG tablet, Take 1 tablet (100 mg total) by mouth daily as needed for erectile dysfunction., Disp: 3 tablet, Rfl: 11 .  sodium chloride (OCEAN) 0.65 % SOLN nasal spray,  Place 2 sprays into both nostrils as needed for congestion., Disp: , Rfl:   Review of Systems:  Constitutional: Denies fever, chills, diaphoresis, appetite change and fatigue.  HEENT: Denies photophobia, eye pain, redness, hearing loss, ear pain, congestion, sore throat, rhinorrhea, sneezing, mouth sores, trouble swallowing, neck pain, neck stiffness and tinnitus.   Respiratory: Denies SOB, DOE, cough, chest tightness,  and wheezing.   Cardiovascular: Denies chest pain, palpitations and leg swelling.  Gastrointestinal: Denies nausea, vomiting, abdominal pain, diarrhea, constipation, blood in stool and abdominal distention.  Genitourinary: Denies dysuria, urgency, frequency, hematuria, flank pain and difficulty urinating.  Endocrine: Denies: hot or cold intolerance, sweats, changes in hair or nails,  polydipsia. Musculoskeletal: Denies myalgias, back pain, joint swelling, arthralgias and gait problem.  Skin: Denies pallor, rash and wound.  Neurological: Denies dizziness, seizures, syncope, weakness, light-headedness, numbness and headaches.  Hematological: Denies adenopathy. Easy bruising, personal or family bleeding history  Psychiatric/Behavioral: Denies suicidal ideation, mood changes, confusion, nervousness, sleep disturbance and agitation    Physical Exam: Vitals:   04/23/20 0941  BP: 120/88  Pulse: 84  Temp: 97.9 F (36.6 C)  TempSrc: Oral  SpO2: 97%  Weight: 265 lb 9.6 oz (120.5 kg)  Height: 6\' 2"  (1.88 m)    Body mass index is 34.1 kg/m.   Constitutional: NAD, calm, comfortable Eyes: PERRL, lids and conjunctivae normal ENMT: Mucous membranes are moist. Posterior pharynx clear of any exudate or lesions. Normal dentition. Tympanic membrane is pearly white, no erythema or bulging. Neck: normal, supple,  no masses, no thyromegaly Respiratory: clear to auscultation bilaterally, no wheezing, no crackles. Normal respiratory effort. No accessory muscle use.  Cardiovascular:  Regular rate and rhythm, no murmurs / rubs / gallops. No extremity edema. 2+ pedal pulses.  Abdomen: no tenderness, no masses palpated. No hepatosplenomegaly. Bowel sounds positive.  Musculoskeletal: no clubbing / cyanosis. No joint deformity upper and lower extremities. Good ROM, no contractures. Normal muscle tone.  Skin: no rashes, lesions, ulcers. No induration Neurologic: CN 2-12 grossly intact. Sensation intact, DTR normal. Strength 5/5 in all 4.  Psychiatric: Normal judgment and insight. Alert and oriented x 3. Normal mood.    Impression and Plan:  Encounter for preventive health examination  -Advised to update eye and dental care. -All immunizations are up-to-date. -Screening labs today. -Healthy lifestyle discussed in detail. -Refer to GI for initial screening colonoscopy.  Screening for malignant neoplasm of colon  - Plan: Ambulatory referral to Gastroenterology  Obstructive sleep apnea -On nightly CPAP.  Exogenous obesity -Discussed healthy lifestyle, including increased physical activity and better food choices to promote weight loss.  Anxiety state -Well-controlled on fluoxetine.    Patient Instructions   -Nice seeing you today!!  -Lab work today; will notify you once results are available.  -remember to schedule your eye and dental exams.  -Schedule follow up with me in 1 year or sooner as needed.   Textbook of Natural Medicine (5th ed., pp. [phone]). [place], [place]: Elsevier.">  Dietary Guidelines to Help Prevent Kidney Stones Kidney stones are deposits of minerals and salts that form inside your kidneys. Your risk of developing kidney stones may be greater depending on your diet, your lifestyle, the medicines you take, and whether you have certain medical conditions. Most people can lower their chances of developing kidney stones by following the instructions below. Your dietitian may give you more specific instructions depending on your overall health  and the type of kidney stones you tend to develop. What are tips for following this plan? Reading food labels  Choose foods with "no salt added" or "low-salt" labels. Limit your salt (sodium) intake to less than 1,500 mg a day.  Choose foods with calcium for each meal and snack. Try to eat about 300 mg of calcium at each meal. Foods that contain 200-500 mg of calcium a serving include: ? 8 oz (237 mL) of milk, calcium-fortifiednon-dairy milk, and calcium-fortifiedfruit juice. Calcium-fortified means that calcium has been added to these drinks. ? 8 oz (237 mL) of kefir, yogurt, and soy yogurt. ? 4 oz (114 g) of tofu. ? 1 oz (28 g) of cheese. ? 1 cup (150 g) of dried figs. ? 1 cup (91 g) of cooked broccoli. ? One 3 oz (85 g) can of sardines or mackerel. Most people need 1,000-1,500 mg of calcium a day. Talk to your dietitian about how much calcium is recommended for you.   Shopping  Buy plenty of fresh fruits and vegetables. Most people do not need to avoid fruits and vegetables, even if these foods contain nutrients that may contribute to kidney stones.  When shopping for convenience foods, choose: ? Whole pieces of fruit. ? Pre-made salads with dressing on the side. ? Low-fat fruit and yogurt smoothies.  Avoid buying frozen meals or prepared deli foods. These can be high in sodium.  Look for foods with live cultures, such as yogurt and kefir.  Choose high-fiber grains, such as whole-wheat breads, oat bran, and wheat cereals. Cooking  Do not add salt to food when  cooking. Place a salt shaker on the table and allow each person to add his or her own salt to taste.  Use vegetable protein, such as beans, textured vegetable protein (TVP), or tofu, instead of meat in pasta, casseroles, and soups. Meal planning  Eat less salt, if told by your dietitian. To do this: ? Avoid eating processed or pre-made food. ? Avoid eating fast food.  Eat less animal protein, including cheese, meat,  poultry, or fish, if told by your dietitian. To do this: ? Limit the number of times you have meat, poultry, fish, or cheese each week. Eat a diet free of meat at least 2 days a week. ? Eat only one serving each day of meat, poultry, fish, or seafood. ? When you prepare animal protein, cut pieces into small portion sizes. For most meat and fish, one serving is about the size of the palm of your hand.  Eat at least five servings of fresh fruits and vegetables each day. To do this: ? Keep fruits and vegetables on hand for snacks. ? Eat one piece of fruit or a handful of berries with breakfast. ? Have a salad and fruit at lunch. ? Have two kinds of vegetables at dinner.  Limit foods that are high in a substance called oxalate. These include: ? Spinach (cooked), rhubarb, beets, sweet potatoes, and Swiss chard. ? Peanuts. ? Potato chips, french fries, and baked potatoes with skin on. ? Nuts and nut products. ? Chocolate.  If you regularly take a diuretic medicine, make sure to eat at least 1 or 2 servings of fruits or vegetables that are high in potassium each day. These include: ? Avocado. ? Banana. ? Orange, prune, carrot, or tomato juice. ? Baked potato. ? Cabbage. ? Beans and split peas. Lifestyle  Drink enough fluid to keep your urine pale yellow. This is the most important thing you can do. Spread your fluid intake throughout the day.  If you drink alcohol: ? Limit how much you use to:  0-1 drink a day for women who are not pregnant.  0-2 drinks a day for men. ? Be aware of how much alcohol is in your drink. In the [place] one drink equals one 12 oz bottle of beer (355 mL), one 5 oz glass of wine (148 mL), or one 1 oz glass of hard liquor (44 mL).  Lose weight if told by your health care provider. Work with your dietitian to find an eating plan and weight loss strategies that work best for you.   General information  Talk to your health care provider and dietitian about taking  daily supplements. You may be told the following depending on your health and the cause of your kidney stones: ? Not to take supplements with vitamin C. ? To take a calcium supplement. ? To take a daily probiotic supplement. ? To take other supplements such as magnesium, fish oil, or vitamin B6.  Take over-the-counter and prescription medicines only as told by your health care provider. These include supplements. What foods should I limit? Limit your intake of the following foods, or eat them as told by your dietitian. Vegetables Spinach. Rhubarb. Beets. Canned vegetables. Rosita Fire. Olives. Baked potatoes with skin. Grains Wheat bran. Baked goods. Salted crackers. Cereals high in sugar. Meats and other proteins Nuts. Nut butters. Large portions of meat, poultry, or fish. Salted, precooked, or cured meats, such as sausages, meat loaves, and hot dogs. Dairy Cheese. Beverages Regular soft drinks. Regular vegetable juice. Seasonings  and condiments Seasoning blends with salt. Salad dressings. Soy sauce. Ketchup. Barbecue sauce. Other foods Canned soups. Canned pasta sauce. Casseroles. Pizza. Lasagna. Frozen meals. Potato chips. [place] fries. The items listed above may not be a complete list of foods and beverages you should limit. Contact a dietitian for more information. What foods should I avoid? Talk to your dietitian about specific foods you should avoid based on the type of kidney stones you have and your overall health. Fruits Grapefruit. The item listed above may not be a complete list of foods and beverages you should avoid. Contact a dietitian for more information. Summary  Kidney stones are deposits of minerals and salts that form inside your kidneys.  You can lower your risk of kidney stones by making changes to your diet.  The most important thing you can do is drink enough fluid. Drink enough fluid to keep your urine pale yellow.  Talk to your dietitian about how much  calcium you should have each day, and eat less salt and animal protein as told by your dietitian. This information is not intended to replace advice given to you by your health care provider. Make sure you discuss any questions you have with your health care provider. Document Revised: 01/16/2019 Document Reviewed: 01/16/2019 Elsevier Patient Education  2021 [employer]  Preventive Care 9640-48 Years Old, Male Preventive care refers to lifestyle choices and visits with your health care provider that can promote health and wellness. This includes:  A yearly physical exam. This is also called an annual wellness visit.  Regular dental and eye exams.  Immunizations.  Screening for certain conditions.  Healthy lifestyle choices, such as: ? Eating a healthy diet. ? Getting regular exercise. ? Not using drugs or products that contain nicotine and tobacco. ? Limiting alcohol use. What can I expect for my preventive care visit? Physical exam Your health care provider will check your:  Height and weight. These may be used to calculate your BMI (body mass index). BMI is a measurement that tells if you are at a healthy weight.  Heart rate and blood pressure.  Body temperature.  Skin for abnormal spots. Counseling Your health care provider may ask you questions about your:  Past medical problems.  Family's medical history.  Alcohol, tobacco, and drug use.  Emotional well-being.  Home life and relationship well-being.  Sexual activity.  Diet, exercise, and sleep habits.  Work and work Astron[employer]vironment.  Access to firearms. What immunizations do I need? Vaccines are usually given at various ages, according to a schedule. Your health care provider will recommend vaccines for you based on your age, medical history, and lifestyle or other factors, such as travel or where you work.   What tests do I need? Blood tests  Lipid and cholesterol levels. These may be checked every 5 years,  or more often if you are over 48 years old.  Hepatitis C test.  Hepatitis B test. Screening  Lung cancer screening. You may have this screening every year starting at age 455 if you have a 30-pack-year history of smoking and currently smoke or have quit within the past 15 years.  Prostate cancer screening. Recommendations will vary depending on your family history and other risks.  Genital exam to check for testicular cancer or hernias.  Colorectal cancer screening. ? All adults should have this screening starting at age 48 and continuing until age 48. ? Your health care provider may recommend screening at age 48 if you are at increased  risk. ? You will have tests every 1-10 years, depending on your results and the type of screening test.  Diabetes screening. ? This is done by checking your blood sugar (glucose) after you have not eaten for a while (fasting). ? You may have this done every 1-3 years.  STD (sexually transmitted disease) testing, if you are at risk. Follow these instructions at home: Eating and drinking  Eat a diet that includes fresh fruits and vegetables, whole grains, lean protein, and low-fat dairy products.  Take vitamin and mineral supplements as recommended by your health care provider.  Do not drink alcohol if your health care provider tells you not to drink.  If you drink alcohol: ? Limit how much you have to 0-2 drinks a day. ? Be aware of how much alcohol is in your drink. In the [place] one drink equals one 12 oz bottle of beer (355 mL), one 5 oz glass of wine (148 mL), or one 1 oz glass of hard liquor (44 mL).   Lifestyle  Take daily care of your teeth and gums. Brush your teeth every morning and night with fluoride toothpaste. Floss one time each day.  Stay active. Exercise for at least 30 minutes 5 or more days each week.  Do not use any products that contain nicotine or tobacco, such as cigarettes, e-cigarettes, and chewing tobacco. If you need  help quitting, ask your health care provider.  Do not use drugs.  If you are sexually active, practice safe sex. Use a condom or other form of protection to prevent STIs (sexually transmitted infections).  If told by your health care provider, take low-dose aspirin daily starting at age 22.  Find healthy ways to cope with stress, such as: ? Meditation, yoga, or listening to music. ? Journaling. ? Talking to a trusted person. ? Spending time with friends and family. Safety  Always wear your seat belt while driving or riding in a vehicle.  Do not drive: ? If you have been drinking alcohol. Do not ride with someone who has been drinking. ? When you are tired or distracted. ? While texting.  Wear a helmet and other protective equipment during sports activities.  If you have firearms in your house, make sure you follow all gun safety procedures. What's next?  Go to your health care provider once a year for an annual wellness visit.  Ask your health care provider how often you should have your eyes and teeth checked.  Stay up to date on all vaccines. This information is not intended to replace advice given to you by your health care provider. Make sure you discuss any questions you have with your health care provider. Document Revised: 10/22/2018 Document Reviewed: 01/17/2018 Elsevier Patient Education  2021 [employer] Inc[employer]      Chaya Jan, MD [facility]

## 2020-04-23 NOTE — Patient Instructions (Signed)
-Nice seeing you today!!  -Lab work today; will notify you once results are available.  -remember to schedule your eye and dental exams.  -Schedule follow up with me in 1 year or sooner as needed.   Textbook of Natural Medicine (5th ed., pp. [phone]). [place], [place]: Elsevier.">  Dietary Guidelines to Help Prevent Kidney Stones Kidney stones are deposits of minerals and salts that form inside your kidneys. Your risk of developing kidney stones may be greater depending on your diet, your lifestyle, the medicines you take, and whether you have certain medical conditions. Most people can lower their chances of developing kidney stones by following the instructions below. Your dietitian may give you more specific instructions depending on your overall health and the type of kidney stones you tend to develop. What are tips for following this plan? Reading food labels  Choose foods with "no salt added" or "low-salt" labels. Limit your salt (sodium) intake to less than 1,500 mg a day.  Choose foods with calcium for each meal and snack. Try to eat about 300 mg of calcium at each meal. Foods that contain 200-500 mg of calcium a serving include: ? 8 oz (237 mL) of milk, calcium-fortifiednon-dairy milk, and calcium-fortifiedfruit juice. Calcium-fortified means that calcium has been added to these drinks. ? 8 oz (237 mL) of kefir, yogurt, and soy yogurt. ? 4 oz (114 g) of tofu. ? 1 oz (28 g) of cheese. ? 1 cup (150 g) of dried figs. ? 1 cup (91 g) of cooked broccoli. ? One 3 oz (85 g) can of sardines or mackerel. Most people need 1,000-1,500 mg of calcium a day. Talk to your dietitian about how much calcium is recommended for you.   Shopping  Buy plenty of fresh fruits and vegetables. Most people do not need to avoid fruits and vegetables, even if these foods contain nutrients that may contribute to kidney stones.  When shopping for convenience foods, choose: ? Whole pieces of  fruit. ? Pre-made salads with dressing on the side. ? Low-fat fruit and yogurt smoothies.  Avoid buying frozen meals or prepared deli foods. These can be high in sodium.  Look for foods with live cultures, such as yogurt and kefir.  Choose high-fiber grains, such as whole-wheat breads, oat bran, and wheat cereals. Cooking  Do not add salt to food when cooking. Place a salt shaker on the table and allow each person to add his or her own salt to taste.  Use vegetable protein, such as beans, textured vegetable protein (TVP), or tofu, instead of meat in pasta, casseroles, and soups. Meal planning  Eat less salt, if told by your dietitian. To do this: ? Avoid eating processed or pre-made food. ? Avoid eating fast food.  Eat less animal protein, including cheese, meat, poultry, or fish, if told by your dietitian. To do this: ? Limit the number of times you have meat, poultry, fish, or cheese each week. Eat a diet free of meat at least 2 days a week. ? Eat only one serving each day of meat, poultry, fish, or seafood. ? When you prepare animal protein, cut pieces into small portion sizes. For most meat and fish, one serving is about the size of the palm of your hand.  Eat at least five servings of fresh fruits and vegetables each day. To do this: ? Keep fruits and vegetables on hand for snacks. ? Eat one piece of fruit or a handful of berries with breakfast. ? Have a  salad and fruit at lunch. ? Have two kinds of vegetables at dinner.  Limit foods that are high in a substance called oxalate. These include: ? Spinach (cooked), rhubarb, beets, sweet potatoes, and Swiss chard. ? Peanuts. ? Potato chips, french fries, and baked potatoes with skin on. ? Nuts and nut products. ? Chocolate.  If you regularly take a diuretic medicine, make sure to eat at least 1 or 2 servings of fruits or vegetables that are high in potassium each day. These include: ? Avocado. ? Banana. ? Orange, prune,  carrot, or tomato juice. ? Baked potato. ? Cabbage. ? Beans and split peas. Lifestyle  Drink enough fluid to keep your urine pale yellow. This is the most important thing you can do. Spread your fluid intake throughout the day.  If you drink alcohol: ? Limit how much you use to:  0-1 drink a day for women who are not pregnant.  0-2 drinks a day for men. ? Be aware of how much alcohol is in your drink. In the [place] one drink equals one 12 oz bottle of beer (355 mL), one 5 oz glass of wine (148 mL), or one 1 oz glass of hard liquor (44 mL).  Lose weight if told by your health care provider. Work with your dietitian to find an eating plan and weight loss strategies that work best for you.   General information  Talk to your health care provider and dietitian about taking daily supplements. You may be told the following depending on your health and the cause of your kidney stones: ? Not to take supplements with vitamin C. ? To take a calcium supplement. ? To take a daily probiotic supplement. ? To take other supplements such as magnesium, fish oil, or vitamin B6.  Take over-the-counter and prescription medicines only as told by your health care provider. These include supplements. What foods should I limit? Limit your intake of the following foods, or eat them as told by your dietitian. Vegetables Spinach. Rhubarb. Beets. Canned vegetables. Rosita Fire. Olives. Baked potatoes with skin. Grains Wheat bran. Baked goods. Salted crackers. Cereals high in sugar. Meats and other proteins Nuts. Nut butters. Large portions of meat, poultry, or fish. Salted, precooked, or cured meats, such as sausages, meat loaves, and hot dogs. Dairy Cheese. Beverages Regular soft drinks. Regular vegetable juice. Seasonings and condiments Seasoning blends with salt. Salad dressings. Soy sauce. Ketchup. Barbecue sauce. Other foods Canned soups. Canned pasta sauce. Casseroles. Pizza. Lasagna. Frozen meals.  Potato chips. [place] fries. The items listed above may not be a complete list of foods and beverages you should limit. Contact a dietitian for more information. What foods should I avoid? Talk to your dietitian about specific foods you should avoid based on the type of kidney stones you have and your overall health. Fruits Grapefruit. The item listed above may not be a complete list of foods and beverages you should avoid. Contact a dietitian for more information. Summary  Kidney stones are deposits of minerals and salts that form inside your kidneys.  You can lower your risk of kidney stones by making changes to your diet.  The most important thing you can do is drink enough fluid. Drink enough fluid to keep your urine pale yellow.  Talk to your dietitian about how much calcium you should have each day, and eat less salt and animal protein as told by your dietitian. This information is not intended to replace advice given to you by your health care  provider. Make sure you discuss any questions you have with your health care provider. Document Revised: 01/16/2019 Document Reviewed: 01/16/2019 Elsevier Patient Education  2021 [employer].  Preventive Care 52-19 Years Old, Male Preventive care refers to lifestyle choices and visits with your health care provider that can promote health and wellness. This includes:  A yearly physical exam. This is also called an annual wellness visit.  Regular dental and eye exams.  Immunizations.  Screening for certain conditions.  Healthy lifestyle choices, such as: ? Eating a healthy diet. ? Getting regular exercise. ? Not using drugs or products that contain nicotine and tobacco. ? Limiting alcohol use. What can I expect for my preventive care visit? Physical exam Your health care provider will check your:  Height and weight. These may be used to calculate your BMI (body mass index). BMI is a measurement that tells if you are at a healthy  weight.  Heart rate and blood pressure.  Body temperature.  Skin for abnormal spots. Counseling Your health care provider may ask you questions about your:  Past medical problems.  Family's medical history.  Alcohol, tobacco, and drug use.  Emotional well-being.  Home life and relationship well-being.  Sexual activity.  Diet, exercise, and sleep habits.  Work and work Astronomer.  Access to firearms. What immunizations do I need? Vaccines are usually given at various ages, according to a schedule. Your health care provider will recommend vaccines for you based on your age, medical history, and lifestyle or other factors, such as travel or where you work.   What tests do I need? Blood tests  Lipid and cholesterol levels. These may be checked every 5 years, or more often if you are over 37 years old.  Hepatitis C test.  Hepatitis B test. Screening  Lung cancer screening. You may have this screening every year starting at age 3 if you have a 30-pack-year history of smoking and currently smoke or have quit within the past 15 years.  Prostate cancer screening. Recommendations will vary depending on your family history and other risks.  Genital exam to check for testicular cancer or hernias.  Colorectal cancer screening. ? All adults should have this screening starting at age 48 and continuing until age 39. ? Your health care provider may recommend screening at age 67 if you are at increased risk. ? You will have tests every 1-10 years, depending on your results and the type of screening test.  Diabetes screening. ? This is done by checking your blood sugar (glucose) after you have not eaten for a while (fasting). ? You may have this done every 1-3 years.  STD (sexually transmitted disease) testing, if you are at risk. Follow these instructions at home: Eating and drinking  Eat a diet that includes fresh fruits and vegetables, whole grains, lean protein, and  low-fat dairy products.  Take vitamin and mineral supplements as recommended by your health care provider.  Do not drink alcohol if your health care provider tells you not to drink.  If you drink alcohol: ? Limit how much you have to 0-2 drinks a day. ? Be aware of how much alcohol is in your drink. In the [place] one drink equals one 12 oz bottle of beer (355 mL), one 5 oz glass of wine (148 mL), or one 1 oz glass of hard liquor (44 mL).   Lifestyle  Take daily care of your teeth and gums. Brush your teeth every morning and night with fluoride toothpaste. Floss  one time each day.  Stay active. Exercise for at least 30 minutes 5 or more days each week.  Do not use any products that contain nicotine or tobacco, such as cigarettes, e-cigarettes, and chewing tobacco. If you need help quitting, ask your health care provider.  Do not use drugs.  If you are sexually active, practice safe sex. Use a condom or other form of protection to prevent STIs (sexually transmitted infections).  If told by your health care provider, take low-dose aspirin daily starting at age 68.  Find healthy ways to cope with stress, such as: ? Meditation, yoga, or listening to music. ? Journaling. ? Talking to a trusted person. ? Spending time with friends and family. Safety  Always wear your seat belt while driving or riding in a vehicle.  Do not drive: ? If you have been drinking alcohol. Do not ride with someone who has been drinking. ? When you are tired or distracted. ? While texting.  Wear a helmet and other protective equipment during sports activities.  If you have firearms in your house, make sure you follow all gun safety procedures. What's next?  Go to your health care provider once a year for an annual wellness visit.  Ask your health care provider how often you should have your eyes and teeth checked.  Stay up to date on all vaccines. This information is not intended to replace advice  given to you by your health care provider. Make sure you discuss any questions you have with your health care provider. Document Revised: 10/22/2018 Document Reviewed: 01/17/2018 Elsevier Patient Education  2021 [employer].

## 2020-05-06 ENCOUNTER — Other Ambulatory Visit: Payer: Self-pay | Admitting: Internal Medicine

## 2020-05-06 DIAGNOSIS — F411 Generalized anxiety disorder: Secondary | ICD-10-CM

## 2020-05-25 ENCOUNTER — Telehealth: Payer: Self-pay | Admitting: Internal Medicine

## 2020-05-25 DIAGNOSIS — G4733 Obstructive sleep apnea (adult) (pediatric): Secondary | ICD-10-CM

## 2020-05-25 NOTE — Telephone Encounter (Signed)
Left message on machine for patient to return our call.  Patient will need to call  pulmonology  or we can refer him to one.

## 2020-05-25 NOTE — Telephone Encounter (Signed)
Patient is calling and wanted to see if provider can put an order for a new cpap mask and hose to be sent to [facility] on [address], please advise. CB is [phone]

## 2020-05-26 ENCOUNTER — Telehealth: Payer: Self-pay | Admitting: Internal Medicine

## 2020-05-26 NOTE — Telephone Encounter (Signed)
Patient called back and said he hasn't seen a pulmonologist since 2010 and does need a referral, please advise. CB is [phone]

## 2020-05-26 NOTE — Telephone Encounter (Signed)
Left message on machine for patient that a referral has been placed.

## 2020-05-26 NOTE — Telephone Encounter (Addendum)
error 

## 2020-06-07 ENCOUNTER — Telehealth: Payer: Self-pay | Admitting: Gastroenterology

## 2020-06-07 NOTE — Telephone Encounter (Signed)
Inbound call from patient. Have pre visit schedule 07/14/20 @ 1:00pm. He was asking if the visit could be before 12 or after 2:00 because he will be on a flight during the 1:00 time frame. Next available is PV is 6/16 but with procedure schedule 6/22 was not sure if should schedule that. If could call back [phone]

## 2020-06-07 NOTE — Telephone Encounter (Signed)
Rescheduled the patient's pre-visit for 5/17 over the phone. Pt was very thankful.

## 2020-06-21 ENCOUNTER — Encounter: Payer: 59 | Attending: Internal Medicine | Admitting: Dietician

## 2020-06-21 ENCOUNTER — Other Ambulatory Visit: Payer: Self-pay

## 2020-06-21 ENCOUNTER — Encounter: Payer: Self-pay | Admitting: Dietician

## 2020-06-21 DIAGNOSIS — E1165 Type 2 diabetes mellitus with hyperglycemia: Secondary | ICD-10-CM | POA: Insufficient documentation

## 2020-06-21 NOTE — Patient Instructions (Addendum)
Aim to increase your physical activity. Consider a time you can go for a short walk (15-20 minutes) until you begin your exercise regiment in June.  Consider a One A Day Men's multivitamin.  Work to limit high fat foods like sausage and bacon, high fat cheeses, full fat dairy.  If you snack, have a snack with one serving of carbs and one serving of protein. Use your "Balanced Snacks" sheet for ideas.  Work towards eating three meals a day, about 5-6 hours apart!  Begin to recognize carbohydrates in your food choices!  Begin to build your meals using the proportions of the Balanced Plate. . First, select your carb choice(s) for the meal. . Next, select your source of protein to pair with your carb choice(s). . Finally, complete the remaining half of your meal with a variety of non-starchy vegetables.

## 2020-06-21 NOTE — Progress Notes (Signed)
Diabetes Self-Management Education  Visit Type: First/Initial  Appt. Start Time: 1530 Appt. End Time: 1640  06/21/2020  Travis King, identified by name and date of birth, is a 50 y.o. male with a diagnosis of Diabetes: Type 2.   ASSESSMENT Pt has a degree in communication and journalism. Pt formerly worked with developmentally disabled clients. Pt currently drives [employer] frequently, drives the majority overnight. Pt sleeps in the mornings, and is active in the afternoon.  Meal schedule: First meal: Early afternoon Second Meal: 8-9 pm Third meal: Early am Pt reports eating out a lot, goes to [employer] often. Pt states it is mostly for convenience. Pt does not prepare food much at home. Pt noticed extreme thirst, frequent urination, and a 40 lb weight loss within the last year that prompted them to see their doctor. Pt noticed a decrease in vision during this period as well, pt currently has to use reading glasses. Pt is taking metformin as directed with no GI symptoms, and states their hyperglycemia symptoms have subsided.  Pt reports numerous extended family members died from complications of diabetes (mother, uncles). Pt is not currently checking their blood sugar. Pt states they are not physically active, and need to be more active.  Pt will have a former client of theirs moving in with the in next month, and they have an exercise plan to do with them. Pt states this will be motivation to be more active, as well as changing back to a schedule of working during the day.   Height 6\' 2"  (1.88 m), weight 256 lb 11.2 oz (116.4 kg). Body mass index is 32.96 kg/m.   Diabetes Self-Management Education - 06/21/20 1537      Visit Information   Visit Type First/Initial      Initial Visit   Diabetes Type Type 2    Are you currently following a meal plan? No    Are you taking your medications as prescribed? Yes    Date Diagnosed March, 2022      Health Coping   How would you rate your  overall health? Good      Psychosocial Assessment   Patient Belief/Attitude about Diabetes Motivated to manage diabetes    Self-care barriers None    Self-management support None;Doctor's office    Other persons present Patient    Patient Concerns Nutrition/Meal planning    Special Needs None    Preferred Learning Style No preference indicated    Learning Readiness Ready    How often do you need to have someone help you when you read instructions, pamphlets, or other written materials from your doctor or pharmacy? 1 - Never    What is the last grade level you completed in school? BA degree      Pre-Education Assessment   Patient understands the diabetes disease and treatment process. Needs Instruction    Patient understands incorporating nutritional management into lifestyle. Needs Instruction    Patient undertands incorporating physical activity into lifestyle. Needs Instruction    Patient understands using medications safely. Needs Instruction    Patient understands monitoring blood glucose, interpreting and using results Needs Instruction    Patient understands prevention, detection, and treatment of acute complications. Needs Instruction    Patient understands prevention, detection, and treatment of chronic complications. Needs Instruction    Patient understands how to develop strategies to address psychosocial issues. Needs Instruction    Patient understands how to develop strategies to promote health/change behavior. Needs Instruction  Complications   Last HgB A1C per patient/outside source 10.3 %   04/23/2020   How often do you check your blood sugar? 0 times/day (not testing)    Have you had a dilated eye exam in the past 12 months? No    Have you had a dental exam in the past 12 months? No    Are you checking your feet? No      Dietary Intake   Breakfast Grilled chicken with onions, stewed veggies, salad, water    Snack (morning) couple pieces of cheese    Lunch [employer] - 2 scrambled eggs with cheese, 2 slices of bacon, coffee with splenda and 1 creamer    Dinner Herbies - 2 scrambled eggs with cheese, 2 sausage patties, tomatoes, coffee with splenda and 1 creamer    Beverage(s) water, coffee      Exercise   Exercise Type ADL's    How many days per week to you exercise? 0    How many minutes per day do you exercise? 0    Total minutes per week of exercise 0      Patient Education   Previous Diabetes Education No    Disease state  Definition of diabetes, type 1 and 2, and the diagnosis of diabetes;Factors that contribute to the development of diabetes    Nutrition management  Role of diet in the treatment of diabetes and the relationship between the three main macronutrients and blood glucose level;Food label reading, portion sizes and measuring food.;Information on hints to eating out and maintain blood glucose control.    Physical activity and exercise  Role of exercise on diabetes management, blood pressure control and cardiac health.;Helped patient identify appropriate exercises in relation to his/her diabetes, diabetes complications and other health issue.    Medications Reviewed patients medication for diabetes, action, purpose, timing of dose and side effects.    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 exams;Relationship between chronic complications and blood glucose control;Identified and discussed with patient  current chronic complications    Psychosocial adjustment Role of stress on diabetes    Personal strategies to promote health Lifestyle issues that need to be addressed for better diabetes care      Individualized Goals (developed by patient)   Nutrition Follow meal plan discussed;General guidelines for healthy choices and portions discussed    Physical Activity Exercise 1-2 times per week    Medications take my medication as prescribed    Monitoring  Not Applicable      Post-Education Assessment   Patient  understands the diabetes disease and treatment process. Needs Review    Patient understands incorporating nutritional management into lifestyle. Needs Review    Patient undertands incorporating physical activity into lifestyle. Needs Review    Patient understands using medications safely. Needs Review    Patient understands monitoring blood glucose, interpreting and using results Needs Review    Patient understands prevention, detection, and treatment of acute complications. Needs Review    Patient understands prevention, detection, and treatment of chronic complications. Needs Review    Patient understands how to develop strategies to address psychosocial issues. Needs Review    Patient understands how to develop strategies to promote health/change behavior. Needs Review      Outcomes   Expected Outcomes Demonstrated interest in learning. Expect positive outcomes    Future DMSE 4-6 wks    Program Status Not Completed           Individualized Plan for  Diabetes Self-Management Training:   Learning Objective:  Patient will have a greater understanding of diabetes self-management. Patient education plan is to attend individual and/or group sessions per assessed needs and concerns.   Plan:   Patient Instructions  Aim to increase your physical activity. Consider a time you can go for a short walk (15-20 minutes) until you begin your exercise regiment in June.  Consider a One A Day Men's multivitamin.  Work to limit high fat foods like sausage and bacon, high fat cheeses, full fat dairy.  If you snack, have a snack with one serving of carbs and one serving of protein. Use your "Balanced Snacks" sheet for ideas.  Work towards eating three meals a day, about 5-6 hours apart!  Begin to recognize carbohydrates in your food choices!  Begin to build your meals using the proportions of the Balanced Plate. . First, select your carb choice(s) for the meal. . Next, select your source of  protein to pair with your carb choice(s). . Finally, complete the remaining half of your meal with a variety of non-starchy vegetables.    Expected Outcomes:  Demonstrated interest in learning. Expect positive outcomes  Education material provided: Meal plan card, My Plate and Snack sheet  If problems or questions, patient to contact team via:  Phone and Email  Future DSME appointment: 4-6 wks

## 2020-06-22 ENCOUNTER — Other Ambulatory Visit: Payer: Self-pay

## 2020-06-22 ENCOUNTER — Ambulatory Visit (AMBULATORY_SURGERY_CENTER): Payer: 59 | Admitting: *Deleted

## 2020-06-22 VITALS — Ht 74.0 in | Wt 256.0 lb

## 2020-06-22 DIAGNOSIS — Z1211 Encounter for screening for malignant neoplasm of colon: Secondary | ICD-10-CM

## 2020-06-22 MED ORDER — PEG 3350-KCL-NA BICARB-NACL 420 G PO SOLR: 4000.0000 mL | Freq: Once | ORAL | 0 refills | Status: AC

## 2020-06-22 NOTE — Progress Notes (Signed)
Pt verified name, DOB, address and insurance during PV today. Pt mailed instruction packet to included paper to complete and mail back to [facility] with addressed and stamped envelope, Emmi video, copy of consent form to read and not return, and instructions. PV completed over the phone. Pt encouraged to call with questions or issues. My Chart instructions to pt as well    No egg or soy allergy known to patient  No issues with past sedation with any surgeries or procedures Patient denies ever being told they had issues or difficulty with intubation  No FH of Malignant Hyperthermia No diet pills per patient No home 02 use per patient but uses Cpap nightly for sleep apnea- pt states > 100 episodes with l;ast sleep study  No blood thinners per patient  Pt denies issues with constipation  No A fib or A flutter  EMMI video to pt or via MyChart  COVID 19 guidelines implemented in PV today with Pt and RN  Pt is fully vaccinated  for Covid    Due to the COVID-19 pandemic we are asking patients to follow certain guidelines.  Pt aware of COVID protocols and [facility] guidelines

## 2020-06-29 ENCOUNTER — Encounter: Payer: Self-pay | Admitting: Pulmonary Disease

## 2020-06-29 ENCOUNTER — Other Ambulatory Visit: Payer: Self-pay

## 2020-06-29 ENCOUNTER — Ambulatory Visit (INDEPENDENT_AMBULATORY_CARE_PROVIDER_SITE_OTHER): Payer: 59 | Admitting: Pulmonary Disease

## 2020-06-29 VITALS — BP 120/80 | HR 89 | Temp 97.3°F | Ht 74.0 in | Wt 262.0 lb

## 2020-06-29 DIAGNOSIS — G4733 Obstructive sleep apnea (adult) (pediatric): Secondary | ICD-10-CM

## 2020-06-29 NOTE — Progress Notes (Signed)
Travis King    [id number]    01-19-1971  Primary Care Physician:Hernandez Priscella Mann, MD  Referring Physician: Philip Aspen, Limmie Patricia, MD [address] [place],  [place] [address]  Chief complaint:   Patient with obstructive sleep apnea  HPI:  Patient with obstructive sleep apnea diagnosed over 9 years ago  Current machine is a second machine Current machine is about 48 years old  The last mask he bought, bought it online He states he has to tape the hose in place  Benefits from CPAP Wakes up feeling restored  Sleep time varies significantly Falls asleep in 5 to 10 minutes 2-3 awakenings Awakening time varies  Weight is down about 35 to 40 pounds  He remembers being told that his sleep apnea is very severe  He does have occasional dryness of his mouth in the morning No headaches Dad snored  Sleep is not as restorative without CPAP  Has a history of diabetes, history of anxiety-controlled  He does smoke cigars  Outpatient Encounter Medications as of 06/29/2020  Medication Sig  . atorvastatin (LIPITOR) 40 MG tablet Take 1 tablet (40 mg total) by mouth daily.  Marland Kitchen FLUoxetine (PROZAC) 40 MG capsule TAKE 1 CAPSULE(40 MG) BY MOUTH DAILY  . metFORMIN (GLUCOPHAGE) 1000 MG tablet Take 1 tablet (1,000 mg total) by mouth 2 (two) times daily with a meal.  . Multiple Vitamins-Minerals (MULTIVITAMIN WITH MINERALS) tablet Take 1 tablet by mouth daily.  . sodium chloride (OCEAN) 0.65 % SOLN nasal spray Place 2 sprays into both nostrils as needed for congestion.  . Vitamin D, Ergocalciferol, (DRISDOL) 1.25 MG (50000 UNIT) CAPS capsule Take 1 capsule (50,000 Units total) by mouth every 7 (seven) days for 12 doses.   No facility-administered encounter medications on file as of 06/29/2020.    Allergies as of 06/29/2020  . (No Known Allergies)    Past Medical History:  Diagnosis Date  . Allergy   . Anxiety   . Chronic kidney disease    kidney  stones   . Diabetes mellitus without complication (HCC)   . GERD (gastroesophageal reflux disease)   . Heart murmur    noted age 84 or 9- nothing mentioned currently   . Hyperlipidemia   . Hypertension    no meds - last checked was normal per pt   . Sleep apnea    wears cpap     Past Surgical History:  Procedure Laterality Date  . NO PAST SURGERIES      Family History  Problem Relation Age of Onset  . Heart attack Mother   . Hypertension Mother   . Diabetes Mother   . Dementia Father   . Colon cancer Neg Hx   . Colon polyps Neg Hx   . Esophageal cancer Neg Hx   . Stomach cancer Neg Hx   . Rectal cancer Neg Hx     Social History   Socioeconomic History  . Marital status: Single    Spouse name: Not on file  . Number of children: Not on file  . Years of education: Not on file  . Highest education level: Not on file  Occupational History  . Not on file  Tobacco Use  . Smoking status: Current Some Day Smoker    Types: Cigars  . Smokeless tobacco: Never Used  . Tobacco comment: occ cigars   Substance and Sexual Activity  . Alcohol use: Yes    Comment: occ   .  Drug use: No  . Sexual activity: Not on file  Other Topics Concern  . Not on file  Social History Narrative  . Not on file   Social Determinants of Health   Financial Resource Strain: Not on file  Food Insecurity: Not on file  Transportation Needs: Not on file  Physical Activity: Not on file  Stress: Not on file  Social Connections: Not on file  Intimate Partner Violence: Not on file    Review of Systems  Constitutional: Negative for fatigue.  Respiratory: Positive for apnea.   Cardiovascular: Negative for chest pain.  Psychiatric/Behavioral: Positive for sleep disturbance.    Vitals:   06/29/20 1041  BP: 120/80  Pulse: 89  Temp: (!) 97.3 F (36.3 C)  SpO2: 98%     Physical Exam Constitutional:      Appearance: He is obese.  HENT:     Head: Normocephalic.     Mouth/Throat:      Mouth: Mucous membranes are moist.     Comments: Mallampati 4, crowded oropharynx Eyes:     General:        Right eye: No discharge.        Left eye: No discharge.  Cardiovascular:     Rate and Rhythm: Normal rate and regular rhythm.     Heart sounds: No murmur heard.   Pulmonary:     Effort: No respiratory distress.     Breath sounds: No stridor. No wheezing or rhonchi.  Musculoskeletal:     Cervical back: No rigidity or tenderness.  Neurological:     Mental Status: He is alert.  Psychiatric:        Mood and Affect: Mood normal.    Epworth Sleepiness Scale of 4  Data Reviewed: No compliance download available Reviewing records online-could not find his old sleep study or settings for his CPAP  Assessment:  History of severe obstructive sleep apnea -Compliant with CPAP -Continues to benefit from CPAP use  Need for CPAP supplies -We will contact [facility] to get more information -Prescription for CPAP supplies will be sent  Encouraged to continue weight loss efforts  Plan/Recommendations: Tentative follow-up in 3 to 4 months  Contact [facility] to get information regarding his CPAP and compliance monitoring  Order for CPAP supplies  Encouraged to call with any significant concerns   Virl Diamond MD [facility] 06/29/2020, 10:48 AM  CC: Philip Aspen, Est[place]*

## 2020-06-29 NOTE — Patient Instructions (Signed)
We will contact [facility] -Send them an order for CPAP supplies-mask, hose -Also request that they let us have any information they have about your CPAP  Continue using CPAP on a regular basis  6 to 8 hours of sleep is recommended  Tentative follow-up in about 3 to 4 months  Call us with significant concerns

## 2020-06-30 ENCOUNTER — Telehealth: Payer: Self-pay | Admitting: Pulmonary Disease

## 2020-06-30 NOTE — Telephone Encounter (Signed)
Let patient know that I did review his CPAP compliance  Using CPAP nightly however there is significant mask leak either from the mask or the hose   So, it is important to follow-up with CPAP supplies to replace mask and hose

## 2020-07-01 NOTE — Telephone Encounter (Signed)
I tried to call patient to go over CPAP results and Dr. Wynona Neat recs. There was no answer, left detailed message and callback number for any questions/concerns in accordance with DPR. Nothing further needed at this time.

## 2020-07-27 ENCOUNTER — Telehealth: Payer: Self-pay | Admitting: Gastroenterology

## 2020-07-27 ENCOUNTER — Other Ambulatory Visit: Payer: Self-pay

## 2020-07-27 ENCOUNTER — Encounter: Payer: Self-pay | Admitting: Internal Medicine

## 2020-07-27 ENCOUNTER — Ambulatory Visit (INDEPENDENT_AMBULATORY_CARE_PROVIDER_SITE_OTHER): Payer: 59 | Admitting: Internal Medicine

## 2020-07-27 VITALS — BP 120/84 | HR 75 | Temp 98.3°F | Wt 257.5 lb

## 2020-07-27 DIAGNOSIS — G4733 Obstructive sleep apnea (adult) (pediatric): Secondary | ICD-10-CM | POA: Diagnosis not present

## 2020-07-27 DIAGNOSIS — E669 Obesity, unspecified: Secondary | ICD-10-CM

## 2020-07-27 DIAGNOSIS — E1169 Type 2 diabetes mellitus with other specified complication: Secondary | ICD-10-CM

## 2020-07-27 DIAGNOSIS — I10 Essential (primary) hypertension: Secondary | ICD-10-CM

## 2020-07-27 DIAGNOSIS — E785 Hyperlipidemia, unspecified: Secondary | ICD-10-CM

## 2020-07-27 LAB — LIPID PANEL
Cholesterol: 100 mg/dL (ref 0–200)
HDL: 32.3 mg/dL — ABNORMAL LOW (ref >39.00)
LDL Cholesterol: 53 mg/dL (ref 0–99)
NonHDL: 68.04
Total CHOL/HDL Ratio: 3
Triglycerides: 73 mg/dL (ref 0.0–149.0)
VLDL: 14.6 mg/dL (ref 0.0–40.0)

## 2020-07-27 LAB — POCT GLYCOSYLATED HEMOGLOBIN (HGB A1C): Hemoglobin A1C: 5.6 % (ref 4.0–5.6)

## 2020-07-27 LAB — MICROALBUMIN / CREATININE URINE RATIO
Creatinine,U: 215.7 mg/dL
Microalb Creat Ratio: 0.7 mg/g (ref 0.0–30.0)
Microalb, Ur: 1.4 mg/dL (ref 0.0–1.9)

## 2020-07-27 NOTE — Progress Notes (Signed)
Established Patient Office Visit     This visit occurred during the SARS-CoV-2 public health emergency.  Safety protocols were in place, including screening questions prior to the visit, additional usage of staff PPE, and extensive cleaning of exam room while observing appropriate contact time as indicated for disinfecting solutions.    CC/Reason for Visit: 27-month follow-up chronic medical conditions  HPI: Travis King is a 50 y.o. male who is coming in today for the above mentioned reasons. Past Medical History is significant for: Obesity, type 2 diabetes diagnosed in March 2022, vitamin D deficiency, hyperlipidemia, obstructive sleep apnea and generalized anxiety disorder.  He was started on metformin and atorvastatin in March.  He has been working really hard on diet modification, has met several times with a dietitian.  He is overdue for ophthalmology exam and microalbumin.  He has no acute complaints today.  He is seeing pulmonologist for CPAP titration and supplies.   Past Medical/Surgical History: Past Medical History:  Diagnosis Date   Allergy    Anxiety    Chronic kidney disease    kidney stones    Diabetes mellitus without complication (HCC)    GERD (gastroesophageal reflux disease)    Heart murmur    noted age 36 or 9- nothing mentioned currently    Hyperlipidemia    Hypertension    no meds - last checked was normal per pt    Sleep apnea    wears cpap     Past Surgical History:  Procedure Laterality Date   NO PAST SURGERIES      Social History:  reports that he has been smoking cigars. He has never used smokeless tobacco. He reports current alcohol use. He reports that he does not use drugs.  Allergies: No Known Allergies  Family History:  Family History  Problem Relation Age of Onset   Heart attack Mother    Hypertension Mother    Diabetes Mother    Dementia Father    Colon cancer Neg Hx    Colon polyps Neg Hx    Esophageal cancer Neg Hx     Stomach cancer Neg Hx    Rectal cancer Neg Hx      Current Outpatient Medications:    atorvastatin (LIPITOR) 40 MG tablet, Take 1 tablet (40 mg total) by mouth daily., Disp: 90 tablet, Rfl: 1   FLUoxetine (PROZAC) 40 MG capsule, TAKE 1 CAPSULE(40 MG) BY MOUTH DAILY, Disp: 90 capsule, Rfl: 1   metFORMIN (GLUCOPHAGE) 1000 MG tablet, Take 1 tablet (1,000 mg total) by mouth 2 (two) times daily with a meal., Disp: 180 tablet, Rfl: 1   Multiple Vitamins-Minerals (MULTIVITAMIN WITH MINERALS) tablet, Take 1 tablet by mouth daily., Disp: , Rfl:    sodium chloride (OCEAN) 0.65 % SOLN nasal spray, Place 2 sprays into both nostrils as needed for congestion., Disp: , Rfl:   Review of Systems:  Constitutional: Denies fever, chills, diaphoresis, appetite change and fatigue.  HEENT: Denies photophobia, eye pain, redness, hearing loss, ear pain, congestion, sore throat, rhinorrhea, sneezing, mouth sores, trouble swallowing, neck pain, neck stiffness and tinnitus.   Respiratory: Denies SOB, DOE, cough, chest tightness,  and wheezing.   Cardiovascular: Denies chest pain, palpitations and leg swelling.  Gastrointestinal: Denies nausea, vomiting, abdominal pain, diarrhea, constipation, blood in stool and abdominal distention.  Genitourinary: Denies dysuria, urgency, frequency, hematuria, flank pain and difficulty urinating.  Endocrine: Denies: hot or cold intolerance, sweats, changes in hair or nails, polyuria, polydipsia. Musculoskeletal: Denies myalgias,  back pain, joint swelling, arthralgias and gait problem.  Skin: Denies pallor, rash and wound.  Neurological: Denies dizziness, seizures, syncope, weakness, light-headedness, numbness and headaches.  Hematological: Denies adenopathy. Easy bruising, personal or family bleeding history  Psychiatric/Behavioral: Denies suicidal ideation, mood changes, confusion, nervousness, sleep disturbance and agitation    Physical Exam: Vitals:   07/27/20 1406  BP:  120/84  Pulse: 75  Temp: 98.3 F (36.8 C)  TempSrc: Oral  SpO2: 99%  Weight: 257 lb 8 oz (116.8 kg)    Body mass index is 33.06 kg/m.   Constitutional: NAD, calm, comfortable Eyes: PERRL, lids and conjunctivae normal ENMT: Mucous membranes are moist.  Respiratory: clear to auscultation bilaterally, no wheezing, no crackles. Normal respiratory effort. No accessory muscle use.  Cardiovascular: Regular rate and rhythm, no murmurs / rubs / gallops. No extremity edema.  Neurologic: Grossly intact and nonfocal Psychiatric: Normal judgment and insight. Alert and oriented x 3. Normal mood.    Impression and Plan:  Type 2 diabetes mellitus with other specified complication, without long-term current use of insulin ([place])  -He has managed to bring his A1c down from 10.3 just 3 months ago to 5.6 today with a combination of lifestyle changes and maximal dose metformin. -He is tolerating metformin well. -He will continue lifestyle modifications. -Refer to ophthalmology for diabetic eye exam and check for microalbuminuria today.  Hyperlipidemia associated with type 2 diabetes mellitus ([place])  - Plan: Lipid panel -Last lipid panel in March 2022 with total cholesterol 182, triglycerides 224 and LDL 116.  Goal LDL less than 70. -He was started on atorvastatin 40 mg daily. -Recheck lipids today.  Essential hypertension -Blood pressure is well controlled, not currently on medication.  Obstructive sleep apnea -Followed by pulmonary, on CPAP.  Obesity (BMI 30.0-34.9) -Discussed healthy lifestyle, including increased physical activity and better food choices to promote weight loss.   Time spent: 31 minutes reviewing chart, interviewing and examining patient and formulating plan of care.   Patient Instructions  -Nice seeing you today!!  -Lab work today; will notify you once results are available.  -Schedule follow up in 3-4 months.    Travis Frohlich, MD [facility]  at [facility]

## 2020-07-27 NOTE — Telephone Encounter (Signed)
Please see below.

## 2020-07-27 NOTE — Patient Instructions (Signed)
-  Nice seeing you today!!  -Lab work today; will notify you once results are available.  -Schedule follow up in 3-4 months. 

## 2020-07-27 NOTE — Telephone Encounter (Signed)
Ok, please charge the late cancellation fee per policy  Thanks

## 2020-07-27 NOTE — Telephone Encounter (Signed)
Good afternoon Dr. Christella Hartigan, patient called stating that they were not able to follow colon instructions de to schedule conflict so they rescheduled form 07/28/20 to 11/05/20.

## 2020-07-28 ENCOUNTER — Encounter: Payer: Self-pay | Admitting: Gastroenterology

## 2020-08-03 ENCOUNTER — Encounter: Payer: 59 | Attending: Internal Medicine | Admitting: Dietician

## 2020-08-03 ENCOUNTER — Encounter: Payer: Self-pay | Admitting: Dietician

## 2020-08-03 ENCOUNTER — Other Ambulatory Visit: Payer: Self-pay

## 2020-08-03 DIAGNOSIS — E1169 Type 2 diabetes mellitus with other specified complication: Secondary | ICD-10-CM | POA: Insufficient documentation

## 2020-08-03 NOTE — Patient Instructions (Addendum)
Continue to work towards your short term goal weight of under 240, at a rate of 1-2 pounds per week!  Keep up the consistency to eventually get to your long-term goal weight of 220 lbs!  Drink a minimum of 64 oz of water each day. Purchase a 32 oz water bottle to keep with you all day.  Increase your physical activity at a gradual until you reach the goal of 150 minutes, ideally 30 minutes 5 times a week.  Visit Diabetes.org/recipes for some new food ideas!!!  AMAZING JOB Kollyn! I AM TRULY IMPRESSED IN YOUR PROGRESS!!

## 2020-08-03 NOTE — Progress Notes (Signed)
Diabetes Self-Management Education  Visit Type: Follow-up  Appt. Start Time: 1410 Appt. End Time: 1440  08/03/2020  [name], identified by name and date of birth, is a 50 y.o. male with a diagnosis of Diabetes:  .   ASSESSMENT Pt had lab work done a week ago. **A1c is down to 5.6 from 10.3!!** Pt lipid levels (total cholesterol, triglycerides, LDL) are now in range. Pt reports cutting out sodas and junkfood. Pt now drinks diet soda and has a sugar free chocolate if they get a sweet tooth. Pt has been incorporating more green vegetables, especially broccoli. Pt is using cauliflower rice and "mashed potatoes" now, avoiding regular potatoes and rice. Pt is still going to [employer], but is building their plate based on the diabetes myplate. Also having salads. Pt reports cooking more than before. Pt states they are very motivated to decrease their chances of developing comorbidites associated with diabetes. Pt states that they feel these changes are sustainable and that they are not restricting themself. Pt reports improvement in energy level as well. Pt reports not doing as much exercise as they would like yet, started a new job and got a new roommate. Pt plans on going to the park and exercising with roommate and taking on their fitness goals. Pt is looking forward to having an exercise partner. Pt has changed their work hours a little to go to sleep earlier.  Height 6\' 2"  (1.88 m), weight 258 lb 14.4 oz (117.4 kg). Body mass index is 33.24 kg/m.   Diabetes Self-Management Education - 08/03/20 1422       Visit Information   Visit Type Follow-up      Complications   Last HgB A1C per patient/outside source 5.6 %   07/27/2020     Dietary Intake   Breakfast Coffee w/splenda and almond milk, egg, sausage and cheese bites, half a banana    Lunch Chili, pimento cheese andwich on wheat bread    Dinner 07/29/2020 (evening) Carrots and blue cheese dressing    Beverage(s)  water, coffee, 2 coke Zeros      Exercise   Exercise Type ADL's;Light (walking / raking leaves)   Moving, lifting boxes   How many days per week to you exercise? 3    How many minutes per day do you exercise? 60    Total minutes per week of exercise 180      Post-Education Assessment   Patient understands the diabetes disease and treatment process. Demonstrates understanding / competency    Patient understands incorporating nutritional management into lifestyle. Demonstrates understanding / competency    Patient undertands incorporating physical activity into lifestyle. Needs Review    Patient understands using medications safely. Demonstrates understanding / competency      Outcomes   Expected Outcomes Demonstrated interest in learning. Expect positive outcomes    Future DMSE 3-4 months    Program Status Not Completed      Subsequent Visit   Since your last visit have you continued or begun to take your medications as prescribed? Yes    Since your last visit have you had your blood pressure checked? Yes    Is your most recent blood pressure lower, unchanged, or higher since your last visit? Unchanged    Since your last visit have you experienced any weight changes? Gain    Weight Gain (lbs) 2    Since your last visit, are you checking your blood glucose at least once a day? --  Pt not checking            Individualized Plan for Diabetes Self-Management Training:   Learning Objective:  Patient will have a greater understanding of diabetes self-management. Patient education plan is to attend individual and/or group sessions per assessed needs and concerns.   Plan:   Patient Instructions  Continue to work towards your short term goal weight of under 240, at a rate of 1-2 pounds per week!  Keep up the consistency to eventually get to your long-term goal weight of 220 lbs!  Drink a minimum of 64 oz of water each day. Purchase a 32 oz water bottle to keep with you all  day.  Increase your physical activity at a gradual until you reach the goal of 150 minutes, ideally 30 minutes 5 times a week.  AMAZING JOB Sipriano! I AM TRULY IMPRESSED IN YOUR PROGRESS!!  Expected Outcomes:  Demonstrated interest in learning. Expect positive outcomes  If problems or questions, patient to contact team via:  Phone, or email  Future DSME appointment: 3-4 months

## 2020-08-10 ENCOUNTER — Emergency Department (HOSPITAL_BASED_OUTPATIENT_CLINIC_OR_DEPARTMENT_OTHER)
Admission: EM | Admit: 2020-08-10 | Discharge: 2020-08-10 | Disposition: A | Discharge status: Home / Self Care | Payer: 59 | Attending: Emergency Medicine | Admitting: Emergency Medicine

## 2020-08-10 ENCOUNTER — Other Ambulatory Visit: Payer: Self-pay

## 2020-08-10 ENCOUNTER — Emergency Department (HOSPITAL_BASED_OUTPATIENT_CLINIC_OR_DEPARTMENT_OTHER): Payer: 59 | Admitting: Radiology

## 2020-08-10 DIAGNOSIS — I129 Hypertensive chronic kidney disease with stage 1 through stage 4 chronic kidney disease, or unspecified chronic kidney disease: Secondary | ICD-10-CM | POA: Insufficient documentation

## 2020-08-10 DIAGNOSIS — E785 Hyperlipidemia, unspecified: Secondary | ICD-10-CM | POA: Insufficient documentation

## 2020-08-10 DIAGNOSIS — E1169 Type 2 diabetes mellitus with other specified complication: Secondary | ICD-10-CM | POA: Insufficient documentation

## 2020-08-10 DIAGNOSIS — R0789 Other chest pain: Secondary | ICD-10-CM | POA: Insufficient documentation

## 2020-08-10 DIAGNOSIS — M546 Pain in thoracic spine: Secondary | ICD-10-CM | POA: Diagnosis not present

## 2020-08-10 DIAGNOSIS — Z7984 Long term (current) use of oral hypoglycemic drugs: Secondary | ICD-10-CM | POA: Insufficient documentation

## 2020-08-10 DIAGNOSIS — E1122 Type 2 diabetes mellitus with diabetic chronic kidney disease: Secondary | ICD-10-CM | POA: Diagnosis not present

## 2020-08-10 DIAGNOSIS — F17290 Nicotine dependence, other tobacco product, uncomplicated: Secondary | ICD-10-CM | POA: Insufficient documentation

## 2020-08-10 DIAGNOSIS — Z79899 Other long term (current) drug therapy: Secondary | ICD-10-CM | POA: Insufficient documentation

## 2020-08-10 DIAGNOSIS — N189 Chronic kidney disease, unspecified: Secondary | ICD-10-CM | POA: Diagnosis not present

## 2020-08-10 LAB — BASIC METABOLIC PANEL
Anion gap: 11 (ref 5–15)
BUN: 22 mg/dL — ABNORMAL HIGH (ref 6–20)
CO2: 25 mmol/L (ref 22–32)
Calcium: 9.7 mg/dL (ref 8.9–10.3)
Chloride: 102 mmol/L (ref 98–111)
Creatinine, Ser: 0.97 mg/dL (ref 0.61–1.24)
GFR, Estimated: >60 mL/min (ref >60)
Glucose, Bld: 90 mg/dL (ref 70–99)
Potassium: 3.8 mmol/L (ref 3.5–5.1)
Sodium: 138 mmol/L (ref 135–145)

## 2020-08-10 LAB — CBC
HCT: 43.9 % (ref 39.0–52.0)
Hemoglobin: 14.7 g/dL (ref 13.0–17.0)
MCH: 28.6 pg (ref 26.0–34.0)
MCHC: 33.5 g/dL (ref 30.0–36.0)
MCV: 85.4 fL (ref 80.0–100.0)
Platelets: 297 10*3/uL (ref 150–400)
RBC: 5.14 MIL/uL (ref 4.22–5.81)
RDW: 12.6 % (ref 11.5–15.5)
WBC: 9.2 10*3/uL (ref 4.0–10.5)
nRBC: 0 % (ref 0.0–0.2)

## 2020-08-10 LAB — TROPONIN I (HIGH SENSITIVITY)
Troponin I (High Sensitivity): <2 ng/L (ref <18)
Troponin I (High Sensitivity): <2 ng/L (ref <18)

## 2020-08-10 NOTE — ED Provider Notes (Signed)
[facility] EMERGENCY DEPT Provider Note   CSN: [id number] Arrival date & time: 08/10/20  1612     History Chief Complaint  Patient presents with   Back Pain    Travis King is a 50 y.o. male.  50 year old male with history of type 2 diabetes mellitus, kidney stones, hyperlipidemia, OSA who presents with thoracic back pain.  A few weeks ago, patient began noticing pain in his thoracic back near his shoulder blade.  He had been moving boxes and assumed that it may have been from his increased physical activity.  He intermittently had the pain for several days and occasionally had pain in the anterior left chest.  He clarifies that the back pain and chest pain did not occur at the exact same time but seemed to alternate.  Eventually the pain resolved after he rested and then he was fine for a week.  Recently he moved again and began having the same symptoms.  Episodes of pain last a few seconds to a minute, are nonexertional, and no specific movement or activity seems to bring on symptoms.  Sometimes after he gets up and walks around symptoms feel better.  He denies any associated nausea, vomiting, diaphoresis, shortness of breath, pleuritic chest pain, or problems with exercise tolerance.  He did not take any medications for it because he did not want to mask his symptoms.  He denies any fever, cough/cold symptoms, recent travel, history of blood clots, or history of cancer.  The history is provided by the patient.  Back Pain     Past Medical History:  Diagnosis Date   Allergy    Anxiety    Chronic kidney disease    kidney stones    Diabetes mellitus without complication (HCC)    GERD (gastroesophageal reflux disease)    Heart murmur    noted age 43 or 9- nothing mentioned currently    Hyperlipidemia    Hypertension    no meds - last checked was normal per pt    Sleep apnea    wears cpap     Patient Active Problem List   Diagnosis Date Noted   DM (diabetes  mellitus), type 2 (HCC) 04/23/2020   Hyperlipidemia associated with type 2 diabetes mellitus (HCC) 04/23/2020   Vitamin D deficiency 04/23/2020   Essential hypertension 03/16/2016   Obstructive sleep apnea 04/11/2012   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   Anxiety state 02/28/2007    Past Surgical History:  Procedure Laterality Date   NO PAST SURGERIES         Family History  Problem Relation Age of Onset   Heart attack Mother    Hypertension Mother    Diabetes Mother    Dementia Father    Colon cancer Neg Hx    Colon polyps Neg Hx    Esophageal cancer Neg Hx    Stomach cancer Neg Hx    Rectal cancer Neg Hx     Social History   Tobacco Use   Smoking status: Some Days    Pack years: 0.00    Types: Cigars   Smokeless tobacco: Never   Tobacco comments:    occ cigars   Substance Use Topics   Alcohol use: Yes    Comment: occ    Drug use: No    Home Medications Prior to Admission medications   Medication Sig Start Date End Date Taking? Authorizing Provider  atorvastatin (LIPITOR) 40 MG tablet Take 1 tablet (40 mg total) by mouth daily. 04/23/20  Yes Philip Aspen, Limmie Patricia, MD  FLUoxetine (PROZAC) 40 MG capsule TAKE 1 CAPSULE(40 MG) BY MOUTH DAILY 05/06/20  Yes Philip Aspen, Limmie Patricia, MD  metFORMIN (GLUCOPHAGE) 1000 MG tablet Take 1 tablet (1,000 mg total) by mouth 2 (two) times daily with a meal. 04/23/20  Yes Philip Aspen, Limmie Patricia, MD  sodium chloride (OCEAN) 0.65 % SOLN nasal spray Place 2 sprays into both nostrils as needed for congestion.   Yes [provider]  Multiple Vitamins-Minerals (MULTIVITAMIN WITH MINERALS) tablet Take 1 tablet by mouth daily.    [provider]    Allergies    Patient has no known allergies.  Review of Systems   Review of Systems  Musculoskeletal:  Positive for back pain.  All other systems reviewed and are negative except that which was mentioned in HPI  Physical Exam Updated Vital Signs BP (!) 126/94    Pulse 78   Temp 98.4 F (36.9 C) (Oral)   Resp 16   Ht 6\' 2"  (1.88 m)   Wt 117 kg   SpO2 98%   BMI 33.12 kg/m   Physical Exam Constitutional:      General: He is not in acute distress.    Appearance: Normal appearance.  HENT:     Head: Normocephalic and atraumatic.  Eyes:     Conjunctiva/sclera: Conjunctivae normal.  Cardiovascular:     Rate and Rhythm: Normal rate and regular rhythm.     Heart sounds: Normal heart sounds. No murmur heard. Pulmonary:     Effort: Pulmonary effort is normal.     Breath sounds: Normal breath sounds.  Abdominal:     General: Abdomen is flat. Bowel sounds are normal. There is no distension.     Palpations: Abdomen is soft.     Tenderness: There is no abdominal tenderness.  Musculoskeletal:     Right lower leg: No edema.     Left lower leg: No edema.     Comments: Tenderness medial L thoracic paraspinal muscles just medial to scapula  Skin:    General: Skin is warm and dry.  Neurological:     Mental Status: He is alert and oriented to person, place, and time.     Comments: fluent  Psychiatric:        Mood and Affect: Mood normal.        Behavior: Behavior normal.    ED Results / Procedures / Treatments   Labs (all labs ordered are listed, but only abnormal results are displayed) Labs Reviewed  BASIC METABOLIC PANEL - Abnormal; Notable for the following components:      Result Value   BUN 22 (*)    All other components within normal limits  CBC  TROPONIN I (HIGH SENSITIVITY)  TROPONIN I (HIGH SENSITIVITY)    EKG EKG Interpretation  Date/Time:  Tuesday August 10 2020 16:40:00 EDT Ventricular Rate:  79 PR Interval:  208 QRS Duration: 90 QT Interval:  358 QTC Calculation: 410 R Axis:   32 Text Interpretation: Normal sinus rhythm Normal ECG rate slower, otherwise normal Confirmed by [id number]) on 08/10/2020 6:58:13 PM  Radiology DG Chest 2 View  Result Date: 08/10/2020 CLINICAL DATA:  Chest pain EXAM: CHEST - 2 VIEW  COMPARISON:  None. FINDINGS: The heart size and mediastinal contours are within normal limits. Both lungs are clear. The visualized skeletal structures are unremarkable. IMPRESSION: No active cardiopulmonary disease. Electronically Signed   By: 10/11/2020 M.D.   On: 08/10/2020 17:26  Procedures Procedures   Medications Ordered in ED Medications - No data to display  ED Course  I have reviewed the triage vital signs and the nursing notes.  Pertinent labs & imaging results that were available during my care of the patient were reviewed by me and considered in my medical decision making (see chart for details).    MDM Rules/Calculators/A&P                          Well-appearing on exam, EKG without ischemic changes.  He did have some tenderness of thoracic back.  Serial troponins negative, basic lab work unremarkable.  Chest x-ray clear.  Symptoms last for a few seconds, are not severe, and he has long periods of complete resolution of symptoms therefore I highly doubt aortic dissection.  He has no risk factors for PE and is PERC negative.  Symptoms are also not highly suggestive of ACS.  Given his reassuring work-up and exam and very atypical symptoms, I feel he is safe for discharge with outpatient PCP follow-up.  I have discussed supportive measures including trial of NSAIDs for his symptoms.  I have extensively reviewed return precautions with him and he voiced understanding. Final Clinical Impression(s) / ED Diagnoses Final diagnoses:  Acute left-sided thoracic back pain  Atypical chest pain    Rx / DC Orders ED Discharge Orders     None        Shakendra Griffeth, Ambrose Finland, MD 08/10/20 2052

## 2020-08-10 NOTE — ED Notes (Signed)
Pt states that he feels much better since he got here

## 2020-08-10 NOTE — ED Triage Notes (Signed)
Patient reports to the ER for back pain. Patient reports he has been moving. Patient states the pain is under the left shoulder blade but it radiated around to the front of his chest. Patient denies injury. Patient denies ShOB. Patient states is has been going on for x7 days.

## 2020-08-17 ENCOUNTER — Inpatient Hospital Stay: Payer: 59 | Admitting: Internal Medicine

## 2020-08-24 ENCOUNTER — Other Ambulatory Visit: Payer: Self-pay

## 2020-08-25 ENCOUNTER — Encounter: Payer: Self-pay | Admitting: Internal Medicine

## 2020-08-25 ENCOUNTER — Ambulatory Visit (INDEPENDENT_AMBULATORY_CARE_PROVIDER_SITE_OTHER): Payer: 59 | Admitting: Internal Medicine

## 2020-08-25 VITALS — BP 130/86 | HR 78 | Temp 98.5°F | Ht 74.0 in | Wt 255.5 lb

## 2020-08-25 DIAGNOSIS — M546 Pain in thoracic spine: Secondary | ICD-10-CM

## 2020-08-25 MED ORDER — CYCLOBENZAPRINE HCL 5 MG PO TABS: 5.0000 mg | ORAL_TABLET | Freq: Every evening | ORAL | 0 refills | Status: DC | PRN

## 2020-08-25 NOTE — Progress Notes (Signed)
Acute office Visit     This visit occurred during the SARS-CoV-2 public health emergency.  Safety protocols were in place, including screening questions prior to the visit, additional usage of staff PPE, and extensive cleaning of exam room while observing appropriate contact time as indicated for disinfecting solutions.    CC/Reason for Visit: Back pain  HPI: Travis King is a 50 y.o. male who is coming in today for the above mentioned reasons.  He has been having back pain for about 3 weeks.  The day before pain started he was moving so he had been lifting heavy boxes and furniture.  Pain is located in the left upper/middle thoracic area right above his scapula.  He went to the ED on July 5.  Because he had some radiating pain it looks like they focused on a cardiac work-up which was negative and he was asked to follow-up with me.  He has no radicular symptoms or focal neurologic deficits.  No shortness of breath or chest discomfort.  Past Medical/Surgical History: Past Medical History:  Diagnosis Date   Allergy    Anxiety    Chronic kidney disease    kidney stones    Diabetes mellitus without complication (HCC)    GERD (gastroesophageal reflux disease)    Heart murmur    noted age 13 or 9- nothing mentioned currently    Hyperlipidemia    Hypertension    no meds - last checked was normal per pt    Sleep apnea    wears cpap     Past Surgical History:  Procedure Laterality Date   NO PAST SURGERIES      Social History:  reports that he has been smoking cigars. He has never used smokeless tobacco. He reports current alcohol use. He reports that he does not use drugs.  Allergies: No Known Allergies  Family History:  Family History  Problem Relation Age of Onset   Heart attack Mother    Hypertension Mother    Diabetes Mother    Dementia Father    Colon cancer Neg Hx    Colon polyps Neg Hx    Esophageal cancer Neg Hx    Stomach cancer Neg Hx    Rectal cancer  Neg Hx      Current Outpatient Medications:    atorvastatin (LIPITOR) 40 MG tablet, Take 1 tablet (40 mg total) by mouth daily., Disp: 90 tablet, Rfl: 1   cyclobenzaprine (FLEXERIL) 5 MG tablet, Take 1 tablet (5 mg total) by mouth at bedtime as needed for muscle spasms., Disp: 20 tablet, Rfl: 0   FLUoxetine (PROZAC) 40 MG capsule, TAKE 1 CAPSULE(40 MG) BY MOUTH DAILY, Disp: 90 capsule, Rfl: 1   metFORMIN (GLUCOPHAGE) 1000 MG tablet, Take 1 tablet (1,000 mg total) by mouth 2 (two) times daily with a meal., Disp: 180 tablet, Rfl: 1   Multiple Vitamins-Minerals (MULTIVITAMIN WITH MINERALS) tablet, Take 1 tablet by mouth daily., Disp: , Rfl:    sodium chloride (OCEAN) 0.65 % SOLN nasal spray, Place 2 sprays into both nostrils as needed for congestion., Disp: , Rfl:   Review of Systems:  Constitutional: Denies fever, chills, diaphoresis, appetite change and fatigue.  HEENT: Denies photophobia, eye pain, redness, hearing loss, ear pain, congestion, sore throat, rhinorrhea, sneezing, mouth sores, trouble swallowing, neck pain, neck stiffness and tinnitus.   Respiratory: Denies SOB, DOE, cough, chest tightness,  and wheezing.   Cardiovascular: Denies chest pain, palpitations and leg swelling.  Gastrointestinal: Denies nausea,  vomiting, abdominal pain, diarrhea, constipation, blood in stool and abdominal distention.  Genitourinary: Denies dysuria, urgency, frequency, hematuria, flank pain and difficulty urinating.  Endocrine: Denies: hot or cold intolerance, sweats, changes in hair or nails, polyuria, polydipsia. Musculoskeletal: Denies  joint swelling, arthralgias and gait problem.  Skin: Denies pallor, rash and wound.  Neurological: Denies dizziness, seizures, syncope, weakness, light-headedness, numbness and headaches.  Hematological: Denies adenopathy. Easy bruising, personal or family bleeding history  Psychiatric/Behavioral: Denies suicidal ideation, mood changes, confusion, nervousness, sleep  disturbance and agitation    Physical Exam: Vitals:   08/25/20 1521  BP: 130/86  Pulse: 78  Temp: 98.5 F (36.9 C)  TempSrc: Oral  SpO2: 99%  Weight: 255 lb 8 oz (115.9 kg)  Height: 6\' 2"  (1.88 m)    Body mass index is 32.8 kg/m.   Constitutional: NAD, calm, comfortable Eyes: PERRL, lids and conjunctivae normal ENMT: Mucous membranes are moist.  Neurologic: Grossly intact and nonfocal Psychiatric: Normal judgment and insight. Alert and oriented x 3. Normal mood.    Impression and Plan:  Acute left-sided thoracic back pain  - Plan: cyclobenzaprine (FLEXERIL) 5 MG tablet -Suspect musculoskeletal in origin. -Have advised icing, as needed NSAIDs, back stretches as well as muscle relaxers at bedtime as needed. -If no improvement can consider physical therapy referral in 2 to 3 weeks.   Patient Instructions  -Nice seeing you today!!  -For your back: back stretches daily, icing for 15 minutes 3 times a day, ibuprofen 2 tabs 3 times a day for 5 days and then as needed and flexeril (muscle relaxer) 1 tablet at bedtime as needed. Let me know if not better in 2-3 weeks.    , MD [facility]

## 2020-08-25 NOTE — Patient Instructions (Addendum)
-  Nice seeing you today!!  -For your back: back stretches daily, icing for 15 minutes 3 times a day, ibuprofen 2 tabs 3 times a day for 5 days and then as needed and flexeril (muscle relaxer) 1 tablet at bedtime as needed. Let me know if not better in 2-3 weeks.

## 2020-10-17 IMAGING — CT CT RENAL STONE PROTOCOL
2 of 4 series · 15 of 46 positions shown, 17 images · non-contrast
Comparison: None.

CLINICAL DATA: Left-sided flank pain and vomiting

EXAM:
CT ABDOMEN AND PELVIS WITHOUT CONTRAST
TECHNIQUE: Multidetector CT imaging of the abdomen and pelvis was performed
following the standard protocol without oral or IV contrast.

[Series 3: ap without · axial · non-contrast · 0.83mm/px · z∈[+712,+1162]mm · 12 of 104 slices shown, 14 images]
[im 7/104  soft-tissue]
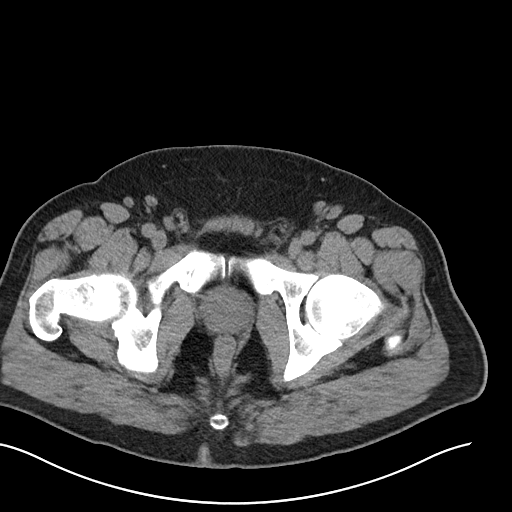
[im 7/104  bone]
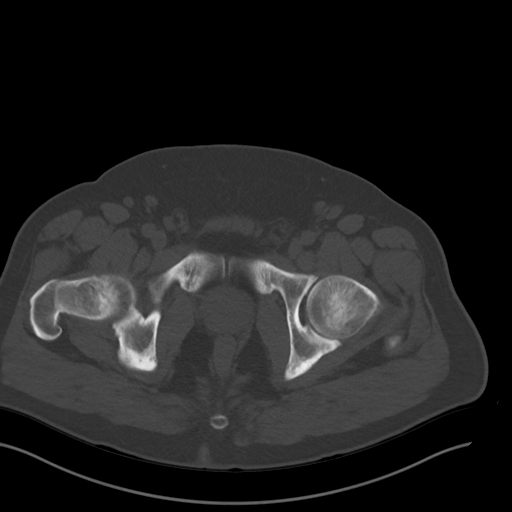
[im 19/104  soft-tissue]
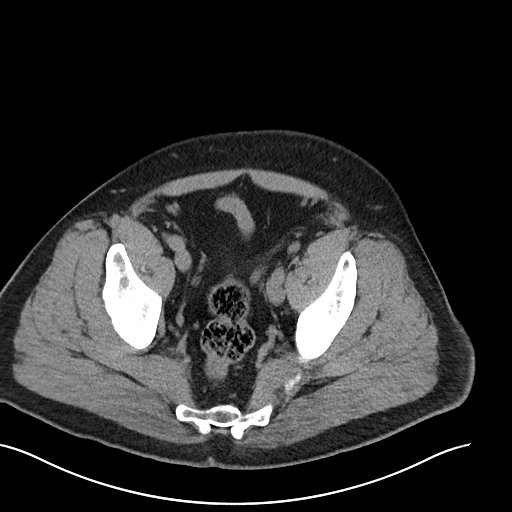
[im 25/104  soft-tissue]
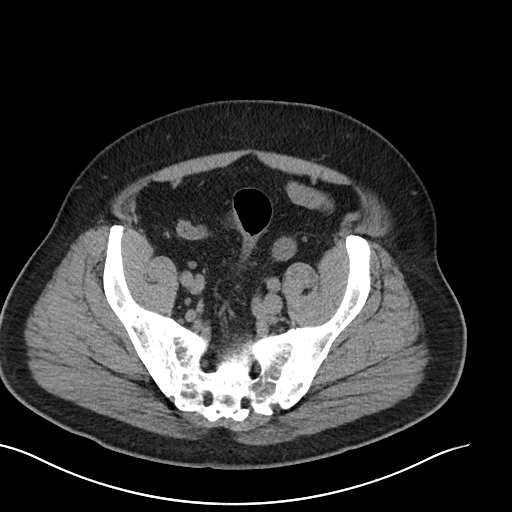
[im 31/104  soft-tissue]
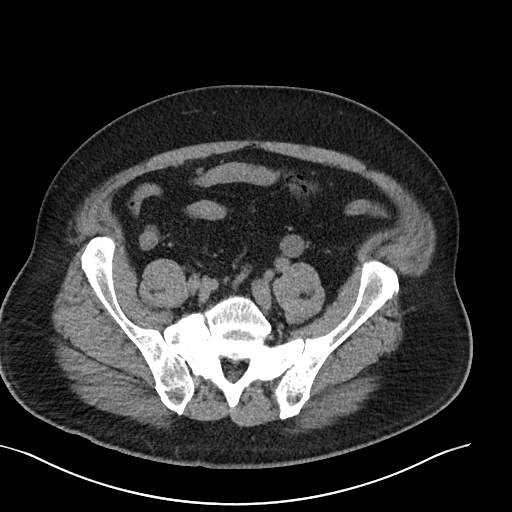
[im 43/104  soft-tissue]
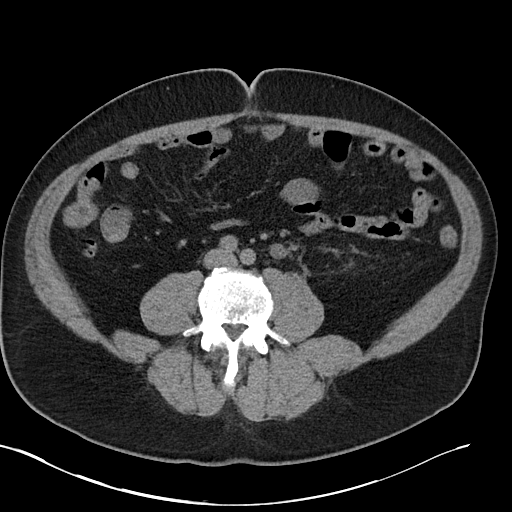
[im 49/104  soft-tissue]
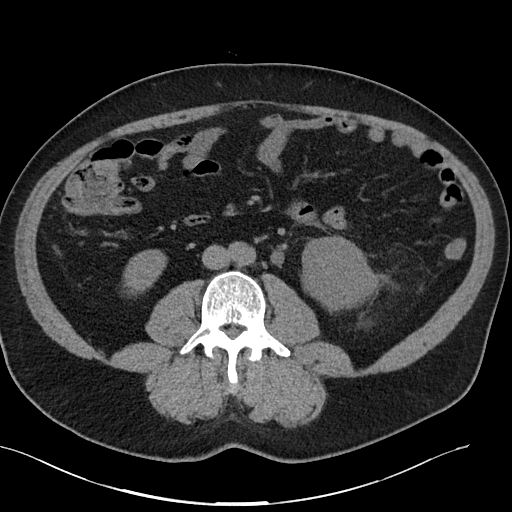
[im 55/104  soft-tissue]
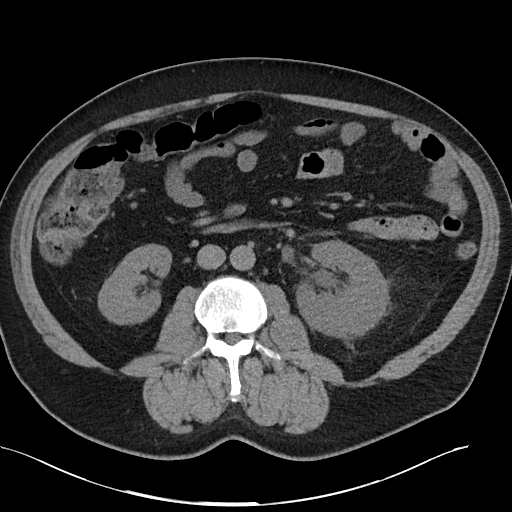
[im 67/104  soft-tissue]
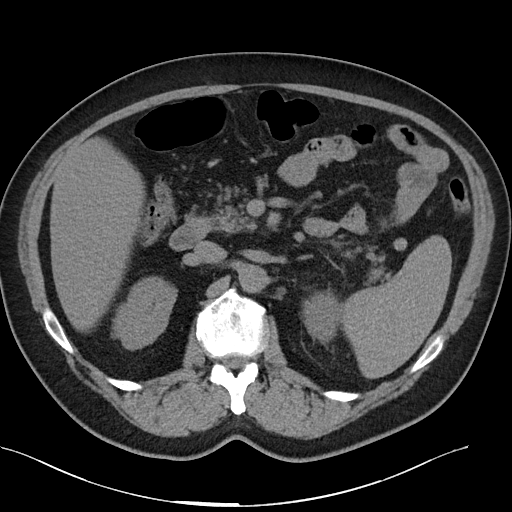
[im 73/104  soft-tissue]
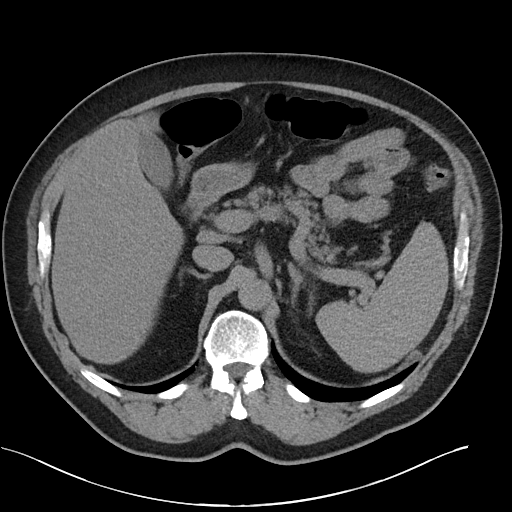
[im 73/104  bone]
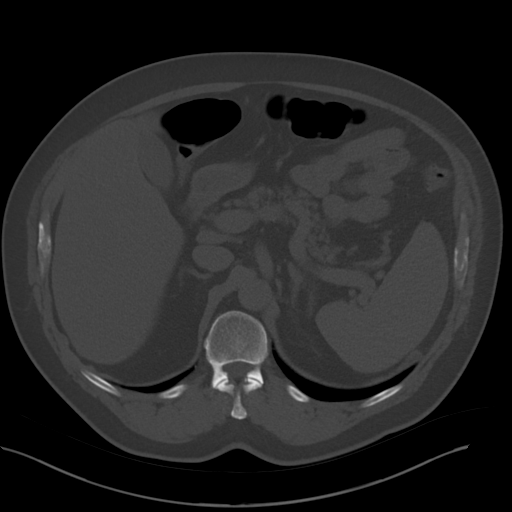
[im 79/104  soft-tissue]
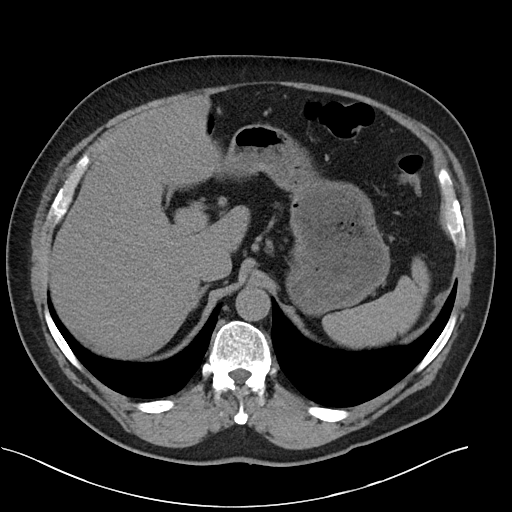
[im 91/104  soft-tissue]
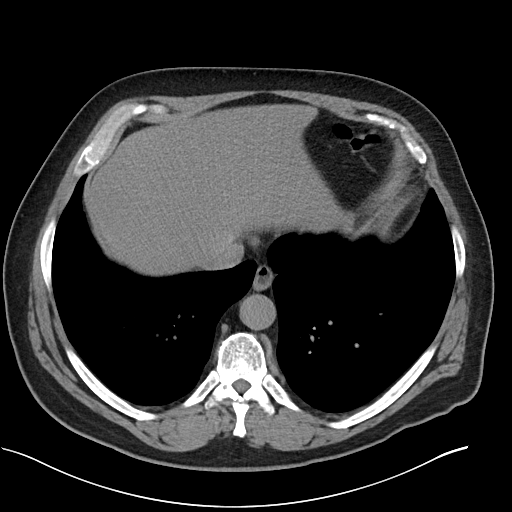
[im 97/104  soft-tissue]
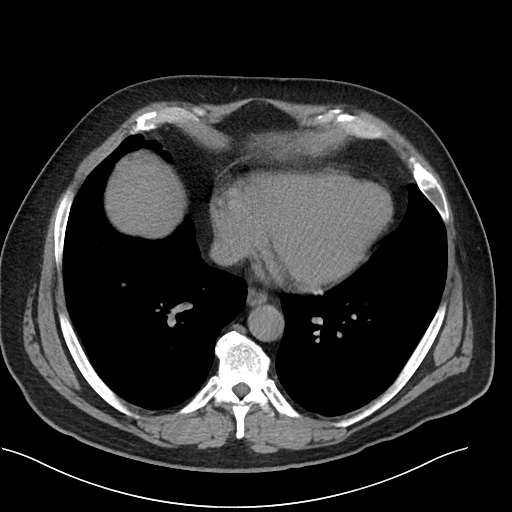

[Series 6: cor · coronal · 0.87mm/px · 3 of 118 slices shown]
[im 40/118  soft-tissue]
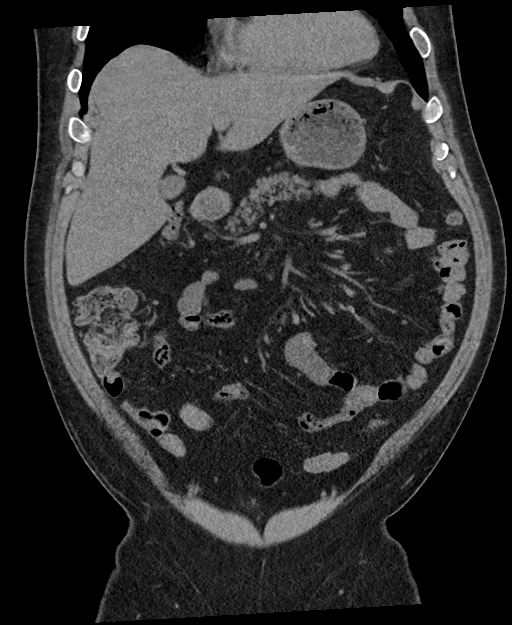
[im 53/118  soft-tissue]
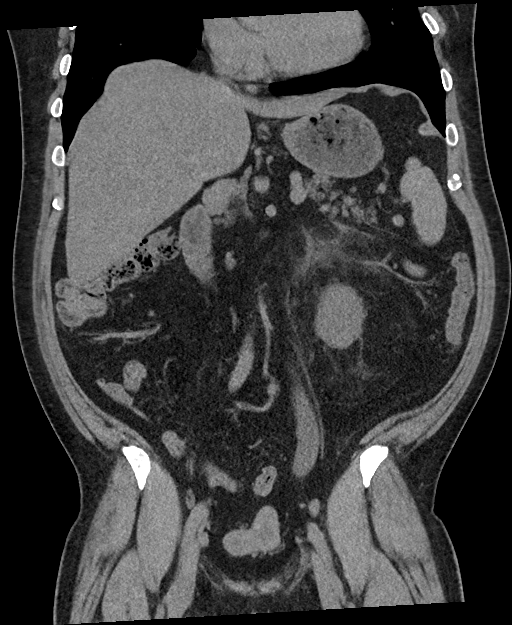
[im 66/118  soft-tissue]
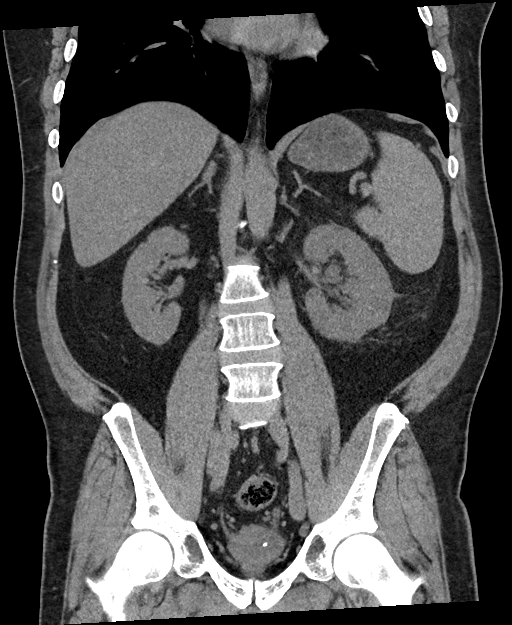

[15 of 46 positions shown; findings below may reference images not displayed]

FINDINGS: Lower chest: No edema or consolidation. On axial slice 8 series 5,
there is a 2 mm nodular opacity in the lateral segment of the right
lower lobe. On axial slice 12 series 5, there is a 3 mm nodular
opacity in the anterior segment of the left lower lobe.

Hepatobiliary: There is hepatic steatosis. No focal liver lesions
are evident on this noncontrast enhanced study. Gallbladder wall is
not appreciably thickened. There is no biliary duct dilatation.

Pancreas: No pancreatic mass or inflammatory focus evident.

Spleen: Spleen measures 13.6 x 13.4 x 8.5 cm with a measured splenic
volume of 775 cubic cm. No focal splenic lesions are evident.

Right adrenal appears normal. There is a mass arising from the left
adrenal measuring 1.6 x 1.4 cm which has attenuation values placing
this lesion in an indeterminate category with respect to etiology.

The left kidney is edematous. There is no renal mass on either side.
There is moderate hydronephrosis on the left. There is no
hydronephrosis on the right. There is a 4 x 3 mm calculus in the
lower pole of the left kidney. No intrarenal calculus evident on the
right. There is a calculus at the left ureterovesical junction
measuring 5 x 4 mm with diffuse ureterectasis on the left. No other
ureteral calculi are evident on either side. Urinary bladder is
midline. Urinary bladder wall is thickened.

Stomach/Bowel: There is no appreciable bowel wall or mesenteric
thickening. Terminal ileum appears normal. No evident bowel
obstruction. There is no free air or portal venous air.

Vascular/Lymphatic: There is no abdominal aortic aneurysm. There is
slight calcification in the right common iliac artery. No adenopathy
is evident in the abdomen or pelvis.

Reproductive: Prostate and seminal vesicles appear normal in size
and contour. There is no evident pelvic mass.

Other: Appendix appears normal. There is no abscess or ascites in
the abdomen or pelvis.

Musculoskeletal: There is degenerative change in the lower thoracic
and lumbar spine regions. There are no blastic or lytic bone
lesions. There is no intramuscular or abdominal wall lesion.
IMPRESSION: 1. There is a 5 x 4 mm calculus at the left ureterovesical junction
with moderate hydronephrosis and ureterectasis on the left. Left
kidney is edematous.

2. Nonobstructing calculus in the lower pole left kidney measuring 4
x 3 mm.

3. Urinary bladder wall thickening consistent with a degree of
cystitis.

4. Splenic enlargement without focal splenic lesion evident on this
noncontrast enhanced study.

5. There is a left adrenal mass measuring 1.6 x 1.4 cm which based
on attenuation values is in an indeterminate category with respect
etiology. Per consensus guidelines, a follow-up CT of the adrenals
in 1 year advised to assess for stability.

6.  Hepatic steatosis.

7. Small nodular opacities in the lung bases, largest measuring 4
mm. No follow-up needed if patient is low-risk (and has no known or
suspected primary neoplasm). Non-contrast chest CT can be considered
in 12 months if patient is high-risk. This recommendation follows
the consensus statement: Guidelines for Management of Incidental
Pulmonary Nodules Detected on CT Images: From the [HOSPITAL]

## 2020-10-22 ENCOUNTER — Ambulatory Visit (AMBULATORY_SURGERY_CENTER): Payer: 59 | Admitting: *Deleted

## 2020-10-22 ENCOUNTER — Other Ambulatory Visit: Payer: Self-pay

## 2020-10-22 VITALS — Ht 74.0 in | Wt 255.0 lb

## 2020-10-22 DIAGNOSIS — Z1211 Encounter for screening for malignant neoplasm of colon: Secondary | ICD-10-CM

## 2020-10-22 NOTE — Progress Notes (Signed)
Patient's pre-visit was done today over the phone with the patient due to COVID-19 pandemic. Name,DOB and address verified. Patient denies any allergies to Eggs and Soy. Patient denies any problems with anesthesia/sedation. Patient is not taking any diet pills or blood thinners. No home Oxygen. Packet of Prep instructions mailed to patient-pt is aware. Patient understands to call us back with any questions or concerns. Patient is aware of our care-partner policy and Covid-19 safety protocol. Patient denies any medical chart hx changes since last GI visit.  EMMI education assigned to the patient for the procedure, sent to MyChart.   The patient is COVID-19 vaccinated.  Patient has Golytely at home.

## 2020-11-05 ENCOUNTER — Encounter: Payer: Self-pay | Admitting: Gastroenterology

## 2020-11-13 ENCOUNTER — Other Ambulatory Visit: Payer: Self-pay | Admitting: Internal Medicine

## 2020-11-13 DIAGNOSIS — E1169 Type 2 diabetes mellitus with other specified complication: Secondary | ICD-10-CM

## 2020-11-23 ENCOUNTER — Encounter: Payer: Self-pay | Admitting: Dietician

## 2020-11-23 ENCOUNTER — Encounter: Payer: 59 | Attending: Internal Medicine | Admitting: Dietician

## 2020-11-23 ENCOUNTER — Other Ambulatory Visit: Payer: Self-pay

## 2020-11-23 DIAGNOSIS — E1169 Type 2 diabetes mellitus with other specified complication: Secondary | ICD-10-CM | POA: Diagnosis not present

## 2020-11-23 NOTE — Patient Instructions (Addendum)
Add in one more day of exercise by yourself at the gym. Make this 45-60 minutes of cardio and weight training. Talk to a trainer at your gym about setting up an exercise regiment.  Continue to pace yourself while eating and pay attention to your hunger and fullness queues!  Continue your healthy weight loss at about 1-2 pounds per week.  Keep up the great work!!

## 2020-11-23 NOTE — Progress Notes (Signed)
Diabetes Self-Management Education  Visit Type: Follow-up  Appt. Start Time: 1445 Appt. End Time: 1520  11/23/2020  [name], identified by name and date of birth, is a 50 y.o. male with a diagnosis of Diabetes:  .   ASSESSMENT Pt feels confident in continuing their lifestyle changes. Pt does not feel restricted or that they are "dieting". Pt states that they have no desire to eat sugar anymore. Pt reports feeling hunger more frequently, and feeling satisfaction earlier. Pt states they had a history of a fast eater, but has been slowing down when they eat. Pt has lost another 8 pounds since last visit. No more symptoms of hyperglycemia.   Pt is going to [employer] less now, has to prepare food at home more. Pt is still trying to follow the balanced plate model when preparing meals. Pt is experimenting with different brands of cauliflower products (rice, mashed).  Pt has purchased a water bottle, states they still don't drink enough fluid consistently.  Pt reports joining a gym with their roommate and exercising for 35-50 minutes a few times a week. Pt is going to begin going more often by themselves to increase physical activity. Pt is usually sleeping about 8 hours at night uninterrupted, still using CPAP. States they sleep very well.  Height 6\' 2"  (1.88 m), weight 250 lb 14.4 oz (113.8 kg). Body mass index is 32.21 kg/m.   Diabetes Self-Management Education - 11/23/20 1458       Visit Information   Visit Type Follow-up      Dietary Intake   Breakfast French toast with keto bread, cinnamon, sugar free syrup, coffee    Snack (morning) none    Lunch 2 salads, chicken soup with mushrooms, diet coke    Snack (afternoon) mixed nuts, beef jerky    Dinner Chicken and mushroom omelette, salad, 2-3 cups of coffee    Snack (evening) none    Beverage(s) coffee, diet coke, water      Exercise   Exercise Type ADL's;Moderate (swimming / aerobic walking)      Subsequent Visit   Since  your last visit have you continued or begun to take your medications as prescribed? Yes    Since your last visit have you had your blood pressure checked? Yes    Is your most recent blood pressure lower, unchanged, or higher since your last visit? Unchanged    Since your last visit have you experienced any weight changes? Loss    Weight Loss (lbs) 8    Since your last visit, are you checking your blood glucose at least once a day? N/A             Individualized Plan for Diabetes Self-Management Training:   Learning Objective:  Patient will have a greater understanding of diabetes self-management. Patient education plan is to attend individual and/or group sessions per assessed needs and concerns.   Plan:   Patient Instructions  Add in one more day of exercise by yourself at the gym. Make this 45-60 minutes of cardio and weight training. Talk to a trainer at your gym about setting up an exercise regiment.  Continue to pace yourself while eating and pay attention to your hunger and fullness queues!  Continue your healthy weight loss at about 1-2 pounds per week.  Keep up the great work!!  Expected Outcomes:   Positive outcomes   If problems or questions, patient to contact team via:  Phone and Email  Future DSME appointment:  2  months

## 2021-01-15 ENCOUNTER — Other Ambulatory Visit: Payer: Self-pay | Admitting: Internal Medicine

## 2021-01-15 DIAGNOSIS — E119 Type 2 diabetes mellitus without complications: Secondary | ICD-10-CM

## 2021-01-18 ENCOUNTER — Ambulatory Visit (INDEPENDENT_AMBULATORY_CARE_PROVIDER_SITE_OTHER): Payer: 59 | Admitting: Internal Medicine

## 2021-01-18 ENCOUNTER — Other Ambulatory Visit: Payer: Self-pay | Admitting: Internal Medicine

## 2021-01-18 ENCOUNTER — Encounter: Payer: Self-pay | Admitting: Internal Medicine

## 2021-01-18 VITALS — BP 120/84 | HR 70 | Temp 97.9°F | Ht 74.0 in | Wt 255.8 lb

## 2021-01-18 DIAGNOSIS — I10 Essential (primary) hypertension: Secondary | ICD-10-CM | POA: Diagnosis not present

## 2021-01-18 DIAGNOSIS — E119 Type 2 diabetes mellitus without complications: Secondary | ICD-10-CM | POA: Diagnosis not present

## 2021-01-18 DIAGNOSIS — F411 Generalized anxiety disorder: Secondary | ICD-10-CM

## 2021-01-18 DIAGNOSIS — Z23 Encounter for immunization: Secondary | ICD-10-CM | POA: Diagnosis not present

## 2021-01-18 DIAGNOSIS — E785 Hyperlipidemia, unspecified: Secondary | ICD-10-CM | POA: Diagnosis not present

## 2021-01-18 DIAGNOSIS — E1169 Type 2 diabetes mellitus with other specified complication: Secondary | ICD-10-CM | POA: Diagnosis not present

## 2021-01-18 LAB — POCT GLYCOSYLATED HEMOGLOBIN (HGB A1C): Hemoglobin A1C: 5.6 % (ref 4.0–5.6)

## 2021-01-18 MED ORDER — METFORMIN HCL 1000 MG PO TABS: 1000.0000 mg | ORAL_TABLET | Freq: Every day | ORAL | 1 refills | Status: DC

## 2021-01-18 NOTE — Progress Notes (Signed)
Established Patient Office Visit     This visit occurred during the SARS-CoV-2 public health emergency.  Safety protocols were in place, including screening questions prior to the visit, additional usage of staff PPE, and extensive cleaning of exam room while observing appropriate contact time as indicated for disinfecting solutions.    CC/Reason for Visit: Follow-up chronic conditions  HPI: Travis King is a 50 y.o. male who is coming in today for the above mentioned reasons. Past Medical History is significant for: Obesity, type 2 diabetes, vitamin D deficiency, hyperlipidemia, obstructive sleep apnea and anxiety.  He is doing well today and has no acute concerns.  He is requesting a flu vaccine.  He has yet to schedule his colonoscopy or diabetic eye exam.   Past Medical/Surgical History: Past Medical History:  Diagnosis Date   Allergy    Anxiety    Chronic kidney disease    kidney stones    Diabetes mellitus without complication (HCC)    GERD (gastroesophageal reflux disease)    Heart murmur    noted age 40 or 9- nothing mentioned currently    Hyperlipidemia    Hypertension    no meds - last checked was normal per pt    Sleep apnea    wears cpap     Past Surgical History:  Procedure Laterality Date   NO PAST SURGERIES      Social History:  reports that he has been smoking cigars. He has never used smokeless tobacco. He reports current alcohol use. He reports that he does not use drugs.  Allergies: No Known Allergies  Family History:  Family History  Problem Relation Age of Onset   Heart attack Mother    Hypertension Mother    Diabetes Mother    Dementia Father    Colon cancer Neg Hx    Colon polyps Neg Hx    Esophageal cancer Neg Hx    Stomach cancer Neg Hx    Rectal cancer Neg Hx      Current Outpatient Medications:    atorvastatin (LIPITOR) 40 MG tablet, Take 1 tablet (40 mg total) by mouth daily., Disp: 90 tablet, Rfl: 1   FLUoxetine  (PROZAC) 40 MG capsule, TAKE 1 CAPSULE(40 MG) BY MOUTH DAILY, Disp: 90 capsule, Rfl: 0   Multiple Vitamins-Minerals (MULTIVITAMIN WITH MINERALS) tablet, Take 1 tablet by mouth daily., Disp: , Rfl:    metFORMIN (GLUCOPHAGE) 1000 MG tablet, Take 1 tablet (1,000 mg total) by mouth daily with breakfast., Disp: 90 tablet, Rfl: 1  Review of Systems:  Constitutional: Denies fever, chills, diaphoresis, appetite change and fatigue.  HEENT: Denies photophobia, eye pain, redness, hearing loss, ear pain, congestion, sore throat, rhinorrhea, sneezing, mouth sores, trouble swallowing, neck pain, neck stiffness and tinnitus.   Respiratory: Denies SOB, DOE, cough, chest tightness,  and wheezing.   Cardiovascular: Denies chest pain, palpitations and leg swelling.  Gastrointestinal: Denies nausea, vomiting, abdominal pain, diarrhea, constipation, blood in stool and abdominal distention.  Genitourinary: Denies dysuria, urgency, frequency, hematuria, flank pain and difficulty urinating.  Endocrine: Denies: hot or cold intolerance, sweats, changes in hair or nails, polyuria, polydipsia. Musculoskeletal: Denies myalgias, back pain, joint swelling, arthralgias and gait problem.  Skin: Denies pallor, rash and wound.  Neurological: Denies dizziness, seizures, syncope, weakness, light-headedness, numbness and headaches.  Hematological: Denies adenopathy. Easy bruising, personal or family bleeding history  Psychiatric/Behavioral: Denies suicidal ideation, mood changes, confusion, nervousness, sleep disturbance and agitation    Physical Exam: Vitals:   01/18/21  1410  BP: 120/84  Pulse: 70  Temp: 97.9 F (36.6 C)  TempSrc: Oral  SpO2: 99%  Weight: 255 lb 12.8 oz (116 kg)  Height: 6\' 2"  (1.88 m)    Body mass index is 32.84 kg/m.   Constitutional: NAD, calm, comfortable Eyes: PERRL, lids and conjunctivae normal ENMT: Mucous membranes are moist.  Respiratory: clear to auscultation bilaterally, no wheezing,  no crackles. Normal respiratory effort. No accessory muscle use.  Cardiovascular: Regular rate and rhythm, no murmurs / rubs / gallops. No extremity edema.  Neurologic: Grossly intact and nonfocal Psychiatric: Normal judgment and insight. Alert and oriented x 3. Normal mood.    Impression and Plan:  Needs flu shot  - Plan: Flu Vaccine QUAD 6+ mos PF IM (Fluarix Quad PF)  Hyperlipidemia associated with type 2 diabetes mellitus (HCC) -Last lipid panel with a total cholesterol of 100 and, triglycerides 74 and LDL 53, continue atorvastatin 40 mg daily.  Essential hypertension -Well-controlled.  Type 2 diabetes mellitus without complication, without long-term current use of insulin ([place])  - Plan: metFORMIN (GLUCOPHAGE) 1000 MG tablet -Well-controlled with an A1c of 5.6, okay to decrease metformin to once daily. -Refer to ophthalmology for diabetic eye exam.   Time spent: 31 minutes reviewing chart, interviewing and examining patient and formulating plan of care.   Patient Instructions  -Nice seeing you today!!  -Decrease metformin 1000 mg to once daily.  -Schedule follow up in 3-4 months.    Lelon Frohlich, MD [facility]

## 2021-01-18 NOTE — Patient Instructions (Signed)
-  Nice seeing you today!!  -Decrease metformin 1000 mg to once daily.  -Schedule follow up in 3-4 months.

## 2021-01-24 ENCOUNTER — Other Ambulatory Visit: Payer: Self-pay

## 2021-01-24 ENCOUNTER — Encounter: Payer: 59 | Attending: Internal Medicine | Admitting: Dietician

## 2021-01-24 DIAGNOSIS — E1169 Type 2 diabetes mellitus with other specified complication: Secondary | ICD-10-CM

## 2021-01-24 NOTE — Progress Notes (Signed)
Diabetes Self-Management Education  Visit Type: Follow-up  Appt. Start Time: 1445 Appt. End Time: 1520  01/24/2021  [name], identified by name and date of birth, is a 50 y.o. male with a diagnosis of Diabetes:  .   ASSESSMENT Pt A1c is stable at 5.6, PCP decreased metformin to OD. Pt is very happy with their progress. Pt has a plan to really focus in and work towards their weight loss goal of getting under 240 lbs. starting in January. Pt is still going to the gym 2-3 times a week, walking 2-3 times a week. Pt enjoys walking most. Pt is working with their roommate to help them lose weight as well.  Pt reports feeling confident in being assertive when turning down offers of sweets and baked goods. Pt reports having no cravings for sweets anymore.  Pt reports planning ahead for meals away from home, has been able to balance their intake well.  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  Height 6\' 2"  (1.88 m), weight 251 lb 11.2 oz (114.2 kg). Body mass index is 32.32 kg/m.   Diabetes Self-Management Education - 01/24/21 1400       Visit Information   Visit Type Follow-up      Dietary Intake   Breakfast 2 pieces of keto toast, PB, cup of coffee w/splenda and zero sugar creamer    Lunch Lasagna, 5 shrimp, Zero sugar root beer    Dinner Half cauliflower crust pizza, salad    Snack (evening) 2 packs of wasabi almonds    Beverage(s) coffee, zero sugar root beer      Exercise   Exercise Type ADL's;Moderate (swimming / aerobic walking)    How many days per week to you exercise? 4    How many minutes per day do you exercise? 60    Total minutes per week of exercise 240      Patient Self-Evaluation of Goals - Patient rates self as meeting previously set goals (% of time)   Nutrition >75%    Physical Activity >75%    Medications >75%    Monitoring Not Applicable    Problem Solving 50 - 75 %    Reducing Risk  >75%    Health Coping >75%      Post-Education Assessment   Patient understands the diabetes disease and treatment process. Demonstrates understanding / competency    Patient understands incorporating nutritional management into lifestyle. Demonstrates understanding / competency    Patient undertands incorporating physical activity into lifestyle. Demonstrates understanding / competency    Patient understands using medications safely. Demonstrates understanding / competency    Patient understands prevention, detection, and treatment of acute complications. Demonstrates understanding / competency    Patient understands prevention, detection, and treatment of chronic complications. Demonstrates understanding / competency    Patient understands how to develop strategies to address psychosocial issues. Demonstrates understanding / competency    Patient understands how to develop strategies to promote health/change behavior. Demonstrates understanding / competency      Outcomes   Expected Outcomes Demonstrated interest in learning. Expect positive outcomes    Future DMSE 2 months    Program Status Not Completed      Subsequent Visit   Since your last visit have you continued or begun to take your medications as prescribed? Yes   Metformin dosage decreased   Since your last visit have you had your blood pressure checked? Yes    Is  your most recent blood pressure lower, unchanged, or higher since your last visit? Unchanged    Since your last visit have you experienced any weight changes? No change    Since your last visit, are you checking your blood glucose at least once a day? N/A             Individualized Plan for Diabetes Self-Management Training:   Learning Objective:  Patient will have a greater understanding of diabetes self-management. Patient education plan is to attend individual and/or group sessions per assessed needs and concerns.   Plan:   Patient Instructions  Randie Heinz  job on keeping your blood sugar well controlled!   Look for weight loss of 1-2 lbs in a week starting in January. The goal is to get down to 240 by March!  Continue to work on Location manager intake. The goal is 64 oz a day.  Keep your water bottle with you while driving [employer]. Take a sip when you find yourself wanting to snack.    Expected Outcomes:  Demonstrated interest in learning. Expect positive outcomes  If problems or questions, patient to contact team via:  Phone and Email  Future DSME appointment: 2 months

## 2021-01-24 NOTE — Patient Instructions (Addendum)
Great job on keeping your blood sugar well controlled!   Look for weight loss of 1-2 lbs in a week starting in January. The goal is to get down to 240 by March!  Continue to work on Location manager intake. The goal is 64 oz a day.  Keep your water bottle with you while driving [employer]. Take a sip when you find yourself wanting to snack.

## 2021-03-16 ENCOUNTER — Encounter: Payer: Self-pay | Admitting: Dietician

## 2021-03-16 ENCOUNTER — Other Ambulatory Visit: Payer: Self-pay

## 2021-03-16 ENCOUNTER — Encounter: Payer: Self-pay | Attending: Internal Medicine | Admitting: Dietician

## 2021-03-16 DIAGNOSIS — E1169 Type 2 diabetes mellitus with other specified complication: Secondary | ICD-10-CM

## 2021-03-16 NOTE — Patient Instructions (Addendum)
Jump right back into your routine now that you will not be out of town for the next few months.   Look for weight loss of around 1-2 pounds each week as you move towards your goal of losing 10 pounds by April. Get pumped up to go skydiving!!  When walking the treadmill or in the park, get your heart rate up quickly and then keep it up at a moderate intensity for at least 20 minutes to maximize your fat burning.  Consider keeping a sugar-free jello, or rehydration packs to add to water when you are driving [employer].  Use your "Protein List" to identify medium, or high fat proteins in your diet. Look to replace them with lean, or plant based sources of protein.

## 2021-03-16 NOTE — Progress Notes (Signed)
Diabetes Self-Management Education  Visit Type: Follow-up  Appt. Start Time: 1400 Appt. End Time: 1435  03/16/2021  [name], identified by name and date of birth, is a 51 y.o. male with a diagnosis of Diabetes:  .   ASSESSMENT Pt has gained 6 pounds since last visit, still has a goal of getting under 250 by April. Pt wants to lose 10 pounds in two months. Pt went on multiple vacations in the past months. Pt reports being able to abstain from sweets for the most part, but ate more than they usually would. Pt went to holiday parties where they were offered lots of sweets and baked goods, but were able to turn them down. Pt is still taking metformin QD religiously. Pt has been walking consistently with their roommate, as well as with another person that walks with more pace and intensity.  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 Pt reports still struggling to drink 64 oz of water, states they are never thirsty. Pt continues to be vigilant in maintaining a lower carb diet.  Height 6\' 2"  (1.88 m), weight 257 lb 3.2 oz (116.7 kg). Body mass index is 33.02 kg/m.   Diabetes Self-Management Education - 03/16/21 1400       Visit Information   Visit Type Follow-up      Dietary Intake   Breakfast Cup of coffee, suagr free creamer, Splenda    Lunch Pimento cheese sandwich, keto bread, handful of wasabi almonds, sharp cheddar cheese    Dinner 05/14/21 - chicken and broccoli, wings, shrimp, egg drop soup, green beans,    Beverage(s) coffee, water      Exercise   Exercise Type ADL's;Moderate (swimming / aerobic walking)    How many days per week to you exercise? 4    How many minutes per day do you exercise? 60    Total minutes per week of exercise 240      Patient Education   Physical activity and exercise  Helped patient identify appropriate exercises in relation to his/her diabetes, diabetes complications and other health issue.      Individualized  Goals (developed by patient)   Physical Activity Exercise 3-5 times per week    Medications take my medication as prescribed      Patient Self-Evaluation of Goals - Patient rates self as meeting previously set goals (% of time)   Nutrition 50 - 75 %    Physical Activity 50 - 75 %    Medications >75%    Monitoring Not Applicable    Problem Solving >75%    Reducing Risk >75%    Health Coping >75%      Post-Education Assessment   Patient understands the diabetes disease and treatment process. Demonstrates understanding / competency    Patient understands incorporating nutritional management into lifestyle. Demonstrates understanding / competency    Patient undertands incorporating physical activity into lifestyle. Demonstrates understanding / competency    Patient understands using medications safely. Demonstrates understanding / competency    Patient understands prevention, detection, and treatment of acute complications. Demonstrates understanding / competency    Patient understands prevention, detection, and treatment of chronic complications. Demonstrates understanding / competency    Patient understands how to develop strategies to address psychosocial issues. Demonstrates understanding / competency    Patient understands how to develop strategies to promote health/change behavior. Demonstrates understanding / competency      Outcomes   Expected Outcomes Demonstrated interest in learning. Expect  positive outcomes    Future DMSE 3-4 months    Program Status Not Completed      Subsequent Visit   Since your last visit have you continued or begun to take your medications as prescribed? Yes    Since your last visit have you had your blood pressure checked? Yes    Is your most recent blood pressure lower, unchanged, or higher since your last visit? Unchanged    Since your last visit have you experienced any weight changes? Gain    Weight Gain (lbs) 6    Since your last visit, are you  checking your blood glucose at least once a day? N/A             Individualized Plan for Diabetes Self-Management Training:   Learning Objective:  Patient will have a greater understanding of diabetes self-management. Patient education plan is to attend individual and/or group sessions per assessed needs and concerns.   Plan:   Patient Instructions  Jump right back into your routine now that you will not be out of town for the next few months.   Look for weight loss of around 1-2 pounds each week as you move towards your goal of losing 10 pounds by April. Get pumped up to go skydiving!!  When walking the treadmill or in the park, get your heart rate up quickly and then keep it up at a moderate intensity for at least 20 minutes to maximize your fat burning.  Consider keeping a sugar-free jello, or rehydration packs to add to water when you are driving [employer].  Use your "Protein List" to identify medium, or high fat proteins in your diet. Look to replace them with lean, or plant based sources of protein.  Expected Outcomes:  Demonstrated interest in learning. Expect positive outcomes  Education material provided: Protein List  If problems or questions, patient to contact team via:  Phone and Email  Future DSME appointment: 3-4 months

## 2021-07-05 ENCOUNTER — Ambulatory Visit: Payer: Self-pay | Admitting: Dietician

## 2021-07-23 ENCOUNTER — Other Ambulatory Visit: Payer: Self-pay | Admitting: Internal Medicine

## 2021-07-23 DIAGNOSIS — E1169 Type 2 diabetes mellitus with other specified complication: Secondary | ICD-10-CM

## 2021-11-09 ENCOUNTER — Other Ambulatory Visit: Payer: Self-pay | Admitting: Internal Medicine

## 2021-11-09 DIAGNOSIS — E1169 Type 2 diabetes mellitus with other specified complication: Secondary | ICD-10-CM

## 2021-11-12 ENCOUNTER — Other Ambulatory Visit: Payer: Self-pay | Admitting: Internal Medicine

## 2021-11-12 DIAGNOSIS — E1169 Type 2 diabetes mellitus with other specified complication: Secondary | ICD-10-CM

## 2021-12-10 ENCOUNTER — Other Ambulatory Visit: Payer: Self-pay | Admitting: Internal Medicine

## 2021-12-10 DIAGNOSIS — E1169 Type 2 diabetes mellitus with other specified complication: Secondary | ICD-10-CM

## 2022-02-05 ENCOUNTER — Other Ambulatory Visit: Payer: Self-pay | Admitting: Internal Medicine

## 2022-02-05 DIAGNOSIS — E119 Type 2 diabetes mellitus without complications: Secondary | ICD-10-CM

## 2022-03-01 ENCOUNTER — Other Ambulatory Visit: Payer: Self-pay | Admitting: Internal Medicine

## 2022-03-01 DIAGNOSIS — E119 Type 2 diabetes mellitus without complications: Secondary | ICD-10-CM

## 2022-03-05 ENCOUNTER — Other Ambulatory Visit: Payer: Self-pay | Admitting: Internal Medicine

## 2022-03-05 DIAGNOSIS — E1169 Type 2 diabetes mellitus with other specified complication: Secondary | ICD-10-CM

## 2022-03-09 ENCOUNTER — Ambulatory Visit: Payer: Self-pay | Admitting: Internal Medicine

## 2022-03-13 ENCOUNTER — Encounter: Payer: Self-pay | Admitting: Internal Medicine

## 2022-03-13 ENCOUNTER — Ambulatory Visit (INDEPENDENT_AMBULATORY_CARE_PROVIDER_SITE_OTHER): Payer: Self-pay | Admitting: Internal Medicine

## 2022-03-13 VITALS — BP 176/107 | HR 75 | Temp 97.8°F | Wt 275.9 lb

## 2022-03-13 DIAGNOSIS — F411 Generalized anxiety disorder: Secondary | ICD-10-CM

## 2022-03-13 DIAGNOSIS — E119 Type 2 diabetes mellitus without complications: Secondary | ICD-10-CM

## 2022-03-13 DIAGNOSIS — E1169 Type 2 diabetes mellitus with other specified complication: Secondary | ICD-10-CM

## 2022-03-13 DIAGNOSIS — I10 Essential (primary) hypertension: Secondary | ICD-10-CM

## 2022-03-13 DIAGNOSIS — G4733 Obstructive sleep apnea (adult) (pediatric): Secondary | ICD-10-CM

## 2022-03-13 DIAGNOSIS — E785 Hyperlipidemia, unspecified: Secondary | ICD-10-CM

## 2022-03-13 DIAGNOSIS — Z23 Encounter for immunization: Secondary | ICD-10-CM

## 2022-03-13 LAB — POCT GLYCOSYLATED HEMOGLOBIN (HGB A1C): Hemoglobin A1C: 7 % — AB (ref 4.0–5.6)

## 2022-03-13 MED ORDER — ATORVASTATIN CALCIUM 40 MG PO TABS: 40.0000 mg | ORAL_TABLET | Freq: Every day | ORAL | 1 refills | Status: DC

## 2022-03-13 MED ORDER — FLUOXETINE HCL 40 MG PO CAPS: ORAL_CAPSULE | ORAL | 1 refills | Status: DC

## 2022-03-13 MED ORDER — METFORMIN HCL 1000 MG PO TABS: 1000.0000 mg | ORAL_TABLET | Freq: Every day | ORAL | 1 refills | Status: DC

## 2022-03-13 MED ORDER — AMLODIPINE BESYLATE 5 MG PO TABS: 5.0000 mg | ORAL_TABLET | Freq: Every day | ORAL | 1 refills | Status: DC

## 2022-03-13 NOTE — Progress Notes (Signed)
Established Patient Office Visit     CC/Reason for Visit: Follow-up chronic conditions  HPI: Travis King is a 52 y.o. male who is coming in today for the above mentioned reasons. Past Medical History is significant for: Hypertension not on medication, hyperlipidemia, type 2 diabetes, obesity, obstructive sleep apnea on CPAP and generalized anxiety disorder.  I have not seen him since December 2022.  He needs medication refills.  Requesting flu vaccine.   Past Medical/Surgical History: Past Medical History:  Diagnosis Date   Allergy    Anxiety    Chronic kidney disease    kidney stones    Diabetes mellitus without complication (HCC)    GERD (gastroesophageal reflux disease)    Heart murmur    noted age 80 or 9- nothing mentioned currently    Hyperlipidemia    Hypertension    no meds - last checked was normal per pt    Sleep apnea    wears cpap     Past Surgical History:  Procedure Laterality Date   NO PAST SURGERIES      Social History:  reports that he has been smoking cigars. He has never used smokeless tobacco. He reports current alcohol use. He reports that he does not use drugs.  Allergies: No Known Allergies  Family History:  Family History  Problem Relation Age of Onset   Heart attack Mother    Hypertension Mother    Diabetes Mother    Dementia Father    Colon cancer Neg Hx    Colon polyps Neg Hx    Esophageal cancer Neg Hx    Stomach cancer Neg Hx    Rectal cancer Neg Hx      Current Outpatient Medications:    amLODipine (NORVASC) 5 MG tablet, Take 1 tablet (5 mg total) by mouth daily., Disp: 90 tablet, Rfl: 1   Multiple Vitamins-Minerals (MULTIVITAMIN WITH MINERALS) tablet, Take 1 tablet by mouth daily., Disp: , Rfl:    atorvastatin (LIPITOR) 40 MG tablet, Take 1 tablet (40 mg total) by mouth daily., Disp: 90 tablet, Rfl: 1   FLUoxetine (PROZAC) 40 MG capsule, TAKE 1 CAPSULE(40 MG) BY MOUTH DAILY, Disp: 90 capsule, Rfl: 1   metFORMIN  (GLUCOPHAGE) 1000 MG tablet, Take 1 tablet (1,000 mg total) by mouth daily with breakfast., Disp: 90 tablet, Rfl: 1  Review of Systems:  Negative unless indicated in HPI.   Physical Exam: Vitals:   03/13/22 1535 03/13/22 1539  BP: (!) 160/100 (!) 176/107  Pulse: 75   Temp: 97.8 F (36.6 C)   TempSrc: Oral   SpO2: 99%   Weight: 275 lb 14.4 oz (125.1 kg)     Body mass index is 35.42 kg/m.   Physical Exam Vitals reviewed.  Constitutional:      Appearance: Normal appearance.  HENT:     Head: Normocephalic and atraumatic.  Eyes:     Conjunctiva/sclera: Conjunctivae normal.     Pupils: Pupils are equal, round, and reactive to light.  Cardiovascular:     Rate and Rhythm: Normal rate and regular rhythm.  Pulmonary:     Effort: Pulmonary effort is normal.     Breath sounds: Normal breath sounds.  Skin:    General: Skin is warm and dry.  Neurological:     General: No focal deficit present.     Mental Status: He is alert and oriented to person, place, and time.  Psychiatric:        Mood and Affect: Mood normal.  Behavior: Behavior normal.        Thought Content: Thought content normal.        Judgment: Judgment normal.      Impression and Plan:  Type 2 diabetes mellitus with other specified complication, without long-term current use of insulin (HCC) - Plan: POC HgB A1c, Urine microalbumin-creatinine with uACR  Hyperlipidemia associated with type 2 diabetes mellitus ([place]) - Plan: atorvastatin (LIPITOR) 40 MG tablet  Anxiety state - Plan: FLUoxetine (PROZAC) 40 MG capsule  Type 2 diabetes mellitus without complication, without long-term current use of insulin (HCC) - Plan: metFORMIN (GLUCOPHAGE) 1000 MG tablet  Essential hypertension - Plan: amLODipine (NORVASC) 5 MG tablet  Obstructive sleep apnea  -A1c of 7.0 is increased from previous but still within range.  I have encouraged lifestyle modifications. -Blood pressure is elevated today, he has been off  medication for years, start amlodipine 5 mg daily, he will do ambulatory blood pressure measurements and return in 3 months for follow-up. -He will schedule visit for annual physical in 3 months. -Refill Prozac, Lipitor and metformin. -Flu vaccine in office today.  Time spent:34 minutes reviewing chart, interviewing and examining patient and formulating plan of care.     Lelon Frohlich, MD [facility] at [facility]

## 2022-03-13 NOTE — Addendum Note (Signed)
Addended by: Westley Hummer B on: 03/13/2022 04:13 PM   Modules accepted: Orders

## 2022-06-12 ENCOUNTER — Ambulatory Visit: Payer: Self-pay | Admitting: Internal Medicine

## 2022-06-26 ENCOUNTER — Ambulatory Visit: Payer: Self-pay | Admitting: Internal Medicine

## 2022-07-10 ENCOUNTER — Ambulatory Visit: Payer: Self-pay | Admitting: Internal Medicine

## 2022-07-10 DIAGNOSIS — E1169 Type 2 diabetes mellitus with other specified complication: Secondary | ICD-10-CM

## 2022-07-11 ENCOUNTER — Other Ambulatory Visit: Payer: Self-pay | Admitting: Internal Medicine

## 2022-07-11 DIAGNOSIS — I10 Essential (primary) hypertension: Secondary | ICD-10-CM

## 2022-09-16 ENCOUNTER — Other Ambulatory Visit: Payer: Self-pay | Admitting: Internal Medicine

## 2022-09-16 DIAGNOSIS — F411 Generalized anxiety disorder: Secondary | ICD-10-CM

## 2022-09-16 DIAGNOSIS — E1169 Type 2 diabetes mellitus with other specified complication: Secondary | ICD-10-CM

## 2022-09-16 DIAGNOSIS — E119 Type 2 diabetes mellitus without complications: Secondary | ICD-10-CM

## 2022-11-22 ENCOUNTER — Encounter (HOSPITAL_BASED_OUTPATIENT_CLINIC_OR_DEPARTMENT_OTHER): Payer: Self-pay

## 2022-11-22 ENCOUNTER — Emergency Department (HOSPITAL_BASED_OUTPATIENT_CLINIC_OR_DEPARTMENT_OTHER)
Admission: EM | Admit: 2022-11-22 | Discharge: 2022-11-23 | Disposition: A | Discharge status: Home / Self Care | Payer: 59 | Attending: Emergency Medicine | Admitting: Emergency Medicine

## 2022-11-22 ENCOUNTER — Other Ambulatory Visit: Payer: Self-pay

## 2022-11-22 DIAGNOSIS — R7402 Elevation of levels of lactic acid dehydrogenase (LDH): Secondary | ICD-10-CM | POA: Diagnosis not present

## 2022-11-22 DIAGNOSIS — E1122 Type 2 diabetes mellitus with diabetic chronic kidney disease: Secondary | ICD-10-CM | POA: Diagnosis not present

## 2022-11-22 DIAGNOSIS — N189 Chronic kidney disease, unspecified: Secondary | ICD-10-CM | POA: Insufficient documentation

## 2022-11-22 DIAGNOSIS — I129 Hypertensive chronic kidney disease with stage 1 through stage 4 chronic kidney disease, or unspecified chronic kidney disease: Secondary | ICD-10-CM | POA: Insufficient documentation

## 2022-11-22 DIAGNOSIS — Z79899 Other long term (current) drug therapy: Secondary | ICD-10-CM | POA: Diagnosis not present

## 2022-11-22 DIAGNOSIS — L0291 Cutaneous abscess, unspecified: Secondary | ICD-10-CM

## 2022-11-22 DIAGNOSIS — L02212 Cutaneous abscess of back [any part, except buttock]: Secondary | ICD-10-CM | POA: Insufficient documentation

## 2022-11-22 DIAGNOSIS — Z7984 Long term (current) use of oral hypoglycemic drugs: Secondary | ICD-10-CM | POA: Insufficient documentation

## 2022-11-22 DIAGNOSIS — E1165 Type 2 diabetes mellitus with hyperglycemia: Secondary | ICD-10-CM | POA: Diagnosis not present

## 2022-11-22 DIAGNOSIS — D72829 Elevated white blood cell count, unspecified: Secondary | ICD-10-CM | POA: Insufficient documentation

## 2022-11-22 LAB — COMPREHENSIVE METABOLIC PANEL
ALT: 19 U/L (ref 0–44)
AST: 10 U/L — ABNORMAL LOW (ref 15–41)
Albumin: 4.3 g/dL (ref 3.5–5.0)
Alkaline Phosphatase: 76 U/L (ref 38–126)
Anion gap: 10 (ref 5–15)
BUN: 16 mg/dL (ref 6–20)
CO2: 26 mmol/L (ref 22–32)
Calcium: 9.2 mg/dL (ref 8.9–10.3)
Chloride: 99 mmol/L (ref 98–111)
Creatinine, Ser: 0.98 mg/dL (ref 0.61–1.24)
GFR, Estimated: >60 mL/min (ref >60)
Glucose, Bld: 217 mg/dL — ABNORMAL HIGH (ref 70–99)
Potassium: 3.5 mmol/L (ref 3.5–5.1)
Sodium: 135 mmol/L (ref 135–145)
Total Bilirubin: 0.6 mg/dL (ref 0.3–1.2)
Total Protein: 7.5 g/dL (ref 6.5–8.1)

## 2022-11-22 LAB — CBC WITH DIFFERENTIAL/PLATELET
Abs Immature Granulocytes: 0.04 10*3/uL (ref 0.00–0.07)
Basophils Absolute: 0.1 10*3/uL (ref 0.0–0.1)
Basophils Relative: 0 %
Eosinophils Absolute: 0.1 10*3/uL (ref 0.0–0.5)
Eosinophils Relative: 1 %
HCT: 41.9 % (ref 39.0–52.0)
Hemoglobin: 14.4 g/dL (ref 13.0–17.0)
Immature Granulocytes: 0 %
Lymphocytes Relative: 14 %
Lymphs Abs: 2.1 10*3/uL (ref 0.7–4.0)
MCH: 29.4 pg (ref 26.0–34.0)
MCHC: 34.4 g/dL (ref 30.0–36.0)
MCV: 85.7 fL (ref 80.0–100.0)
Monocytes Absolute: 1 10*3/uL (ref 0.1–1.0)
Monocytes Relative: 7 %
Neutro Abs: 11.1 10*3/uL — ABNORMAL HIGH (ref 1.7–7.7)
Neutrophils Relative %: 78 %
Platelets: 275 10*3/uL (ref 150–400)
RBC: 4.89 MIL/uL (ref 4.22–5.81)
RDW: 12.5 % (ref 11.5–15.5)
WBC: 14.4 10*3/uL — ABNORMAL HIGH (ref 4.0–10.5)
nRBC: 0 % (ref 0.0–0.2)

## 2022-11-22 LAB — LACTIC ACID, PLASMA: Lactic Acid, Venous: 2.2 mmol/L (ref 0.5–1.9)

## 2022-11-22 MED ORDER — KETOROLAC TROMETHAMINE 30 MG/ML IJ SOLN
30.0000 mg | Freq: Once | INTRAMUSCULAR | Status: AC
  Administered 2022-11-22: 30 mg via INTRAMUSCULAR
  Filled 2022-11-22: qty 1

## 2022-11-22 MED ORDER — DOXYCYCLINE HYCLATE 100 MG PO CAPS: 100.0000 mg | ORAL_CAPSULE | Freq: Two times a day (BID) | ORAL | 0 refills | Status: AC

## 2022-11-22 MED ORDER — LIDOCAINE-EPINEPHRINE (PF) 2 %-1:200000 IJ SOLN
20.0000 mL | Freq: Once | INTRAMUSCULAR | Status: AC
  Administered 2022-11-22: 20 mL
  Filled 2022-11-22: qty 20

## 2022-11-22 MED ORDER — ACETAMINOPHEN 500 MG PO TABS
1000.0000 mg | ORAL_TABLET | Freq: Once | ORAL | Status: AC
  Administered 2022-11-22: 1000 mg via ORAL
  Filled 2022-11-22: qty 2

## 2022-11-22 MED ORDER — DOXYCYCLINE HYCLATE 100 MG PO TABS
100.0000 mg | ORAL_TABLET | Freq: Once | ORAL | Status: AC
  Administered 2022-11-22: 100 mg via ORAL
  Filled 2022-11-22: qty 1

## 2022-11-22 NOTE — ED Notes (Signed)
Pull to triage for re-eval. Fever elevated. Infection order set put in place and med pass performed-see Mar. NAD.

## 2022-11-22 NOTE — ED Triage Notes (Signed)
Pt POV from home reporting new cyst upper R side of back that he noticed Sat. Becoming more painful today

## 2022-11-22 NOTE — ED Provider Notes (Signed)
[place] [facility] AT [facility] Provider Note   CSN: [id number] Arrival date & time: 11/22/22  1830     History {Add pertinent medical, surgical, social history, OB history to HPI:1} Chief Complaint  Patient presents with   Cyst    Travis King is a 52 y.o. male.  The history is provided by the patient.  Patient with history of diabetes, hypertension presents with concern for abscess. Patient reports several days ago he noticed a cyst or abscess in his right upper back.  No trauma or injuries.  Over the past several days it has gotten larger and more red and painful.  He reports he has been traveling and has been unable to seek care. He did not realize he had a fever.  No vomiting.  No cough or shortness of breath.  No chest pain He has not had of these issues recently    Past Medical History:  Diagnosis Date   Allergy    Anxiety    Chronic kidney disease    kidney stones    Diabetes mellitus without complication (HCC)    GERD (gastroesophageal reflux disease)    Heart murmur    noted age 76 or 9- nothing mentioned currently    Hyperlipidemia    Hypertension    no meds - last checked was normal per pt    Sleep apnea    wears cpap     Home Medications Prior to Admission medications   Medication Sig Start Date End Date Taking? Authorizing Provider  doxycycline (VIBRAMYCIN) 100 MG capsule Take 1 capsule (100 mg total) by mouth 2 (two) times daily. One po bid x 7 days 11/22/22  Yes Zadie Rhine, MD  amLODipine (NORVASC) 5 MG tablet TAKE 1 TABLET (5 MG TOTAL) BY MOUTH DAILY. 07/11/22   Philip Aspen, Limmie Patricia, MD  atorvastatin (LIPITOR) 40 MG tablet TAKE 1 TABLET BY MOUTH EVERY DAY 09/19/22   Philip Aspen, Limmie Patricia, MD  FLUoxetine (PROZAC) 40 MG capsule TAKE 1 CAPSULE(40 MG) BY MOUTH DAILY 09/19/22   Philip Aspen, Limmie Patricia, MD  metFORMIN (GLUCOPHAGE) 1000 MG tablet TAKE 1 TABLET BY MOUTH EVERY DAY WITH BREAKFAST 09/19/22   Philip Aspen, Limmie Patricia, MD  Multiple Vitamins-Minerals (MULTIVITAMIN WITH MINERALS) tablet Take 1 tablet by mouth daily.    [provider]      Allergies    Patient has no known allergies.    Review of Systems   Review of Systems  Respiratory:  Negative for cough.   Gastrointestinal:  Negative for vomiting.  Skin:  Positive for wound.    Physical Exam Updated Vital Signs BP (!) 135/93   Pulse (!) 103   Temp (!) 100.4 F (38 C)   Resp 18   Ht 1.88 m (6\' 2" )   Wt 122.5 kg   SpO2 97%   BMI 34.67 kg/m  Physical Exam CONSTITUTIONAL: Well developed/well nourished HEAD: Normocephalic/atraumatic ENMT: Mucous membranes moist NECK: supple no meningeal signs CV: S1/S2 noted, no murmurs/rubs/gallops noted LUNGS: Lungs are clear to auscultation bilaterally, no apparent distress ABDOMEN: soft NEURO: Pt is awake/alert/appropriate, moves all extremitiesx4.  No facial droop.   EXTREMITIES: pulses normal/equal, full ROM Full range of motion of shoulder without difficulty SKIN: warm, color normal Abscess noted to right scapular region, see photo below No crepitus. PSYCH: no abnormalities of mood noted, alert and oriented to situation  ED Results / Procedures / Treatments   Labs (all labs ordered are listed, but only abnormal  results are displayed) Labs Reviewed  LACTIC ACID, PLASMA - Abnormal; Notable for the following components:      Result Value   Lactic Acid, Venous 2.2 (*)    All other components within normal limits  COMPREHENSIVE METABOLIC PANEL - Abnormal; Notable for the following components:   Glucose, Bld 217 (*)    AST 10 (*)    All other components within normal limits  CBC WITH DIFFERENTIAL/PLATELET - Abnormal; Notable for the following components:   WBC 14.4 (*)    Neutro Abs 11.1 (*)    All other components within normal limits  LACTIC ACID, PLASMA    EKG None  Radiology No results found.  Procedures .Marland KitchenIncision and Drainage  Date/Time: 11/23/2022  12:00 AM  Performed by: Zadie Rhine, MD Authorized by: Zadie Rhine, MD     {Document cardiac monitor, telemetry assessment procedure when appropriate:1}  Medications Ordered in ED Medications  acetaminophen (TYLENOL) tablet 1,000 mg (1,000 mg Oral Given 11/22/22 2301)  doxycycline (VIBRA-TABS) tablet 100 mg (100 mg Oral Given 11/22/22 2351)  ketorolac (TORADOL) 30 MG/ML injection 30 mg (30 mg Intramuscular Given 11/22/22 2351)  lidocaine-EPINEPHrine (XYLOCAINE W/EPI) 2 %-1:200000 (PF) injection 20 mL (20 mLs Infiltration Given 11/22/22 2352)    ED Course/ Medical Decision Making/ A&P   {   Click here for ABCD2, HEART and other calculatorsREFRESH Note before signing :1}                              Medical Decision Making Amount and/or Complexity of Data Reviewed Labs: ordered.  Risk OTC drugs. Prescription drug management.   This patient presents to the ED for concern of boil/abscess, this involves an extensive number of treatment options, and is a complaint that carries with it a high risk of complications and morbidity.  The differential diagnosis includes but is not limited to cutaneous abscess, cellulitis, sebaceous cyst, necrotizing fasciitis, septic joint  Comorbidities that complicate the patient evaluation: Patient's presentation is complicated by their history of diabetes  Social Determinants of Health: Patient's  travel schedule   increases the complexity of managing their presentation  Additional history obtained: Records reviewed Primary Care Documents  Lab Tests: I Ordered, and personally interpreted labs.  The pertinent results include: Leukocytosis, hyperglycemia   Medicines ordered and prescription drug management: I ordered medication including Toradol for pain Reevaluation of the patient after these medicines showed that the patient    {resolved/improved/worsened:23923::"improved"}   Critical Interventions:   antibiotics, incision and  drainage  Consultations Obtained: I requested consultation with the {consultation:26851}, and discussed  findings as well as pertinent plan - they recommend: ***  Reevaluation: After the interventions noted above, I reevaluated the patient and found that they have :{resolved/improved/worsened:23923::"improved"}  Complexity of problems addressed: Patient's presentation is most consistent with  {ZOXW:96045}  Disposition: After consideration of the diagnostic results and the patient's response to treatment,  I feel that the patent would benefit from {disposition:26850}.     {Document critical care time when appropriate:1} {Document review of labs and clinical decision tools ie heart score, Chads2Vasc2 etc:1}  {Document your independent review of radiology images, and any outside records:1} {Document your discussion with family members, caretakers, and with consultants:1} {Document social determinants of health affecting pt's care:1} {Document your decision making why or why not admission, treatments were needed:1} Final Clinical Impression(s) / ED Diagnoses Final diagnoses:  Abscess    Rx / DC Orders ED Discharge Orders  Ordered    doxycycline (VIBRAMYCIN) 100 MG capsule  2 times daily        11/22/22 2348

## 2023-01-13 ENCOUNTER — Other Ambulatory Visit: Payer: Self-pay | Admitting: Internal Medicine

## 2023-01-13 DIAGNOSIS — E119 Type 2 diabetes mellitus without complications: Secondary | ICD-10-CM

## 2023-02-10 ENCOUNTER — Other Ambulatory Visit: Payer: Self-pay | Admitting: Internal Medicine

## 2023-02-10 DIAGNOSIS — I10 Essential (primary) hypertension: Secondary | ICD-10-CM

## 2023-03-07 ENCOUNTER — Other Ambulatory Visit: Payer: Self-pay | Admitting: Internal Medicine

## 2023-03-07 DIAGNOSIS — E785 Hyperlipidemia, unspecified: Secondary | ICD-10-CM

## 2023-03-07 DIAGNOSIS — F411 Generalized anxiety disorder: Secondary | ICD-10-CM

## 2023-04-17 ENCOUNTER — Other Ambulatory Visit: Payer: Self-pay | Admitting: Medical Genetics

## 2023-04-24 ENCOUNTER — Other Ambulatory Visit: Payer: Self-pay

## 2023-04-24 DIAGNOSIS — Z006 Encounter for examination for normal comparison and control in clinical research program: Secondary | ICD-10-CM

## 2023-05-02 LAB — GENECONNECT MOLECULAR SCREEN

## 2023-05-04 ENCOUNTER — Other Ambulatory Visit: Payer: Self-pay | Admitting: Medical Genetics

## 2023-05-04 DIAGNOSIS — Z006 Encounter for examination for normal comparison and control in clinical research program: Secondary | ICD-10-CM | POA: Insufficient documentation

## 2023-05-04 NOTE — Progress Notes (Signed)
 Initial testing unable to be performed. Participant requesting to continue in the study. Confirmed consent on file. New order placed

## 2023-05-18 ENCOUNTER — Other Ambulatory Visit: Payer: Self-pay | Admitting: Medical Genetics

## 2023-05-18 ENCOUNTER — Other Ambulatory Visit

## 2023-05-18 DIAGNOSIS — Z006 Encounter for examination for normal comparison and control in clinical research program: Secondary | ICD-10-CM

## 2023-06-10 LAB — GENECONNECT MOLECULAR SCREEN

## 2023-06-12 ENCOUNTER — Telehealth: Payer: Self-pay | Admitting: Medical Genetics

## 2023-06-12 NOTE — Telephone Encounter (Signed)
 [facility] [place] [facility]  06/12/2023 11:14 AM  Confirmed I was speaking with Jonel Nephew [phone] by using name and DOB. Informed participant the reason for this call is to follow-up on a recent buccal sample the participant provided at one of the [facility] lab locations. Informed participant the test was not able to be completed with this sample and apologized for the inconvenience. Participant was requested to provide a new sample at one of our participating labs at no cost so that participant can continue participation and receive test results. Informed participant they do not need to be fasting and if there are other samples that need to be drawn, they can be done at the same visit. Participant has not had a blood transfusion or blood product in the last 30 days. Participant agreed to provide another sample. Participant was provided the [employer] program website to learn why this may have happened. Participant was thanked for their time and continued support of the above study.    Jordyn Pennstrom, BS [place]  [facility] Clinical Research Specialist II Direct Dial: [phone]  Fax: [phone]

## 2023-06-15 ENCOUNTER — Other Ambulatory Visit: Payer: Self-pay | Admitting: Medical Genetics

## 2023-06-15 DIAGNOSIS — Z006 Encounter for examination for normal comparison and control in clinical research program: Secondary | ICD-10-CM

## 2023-06-15 NOTE — Progress Notes (Signed)
 Initial result was a TNP. New order requested. Confirmed consent on file.

## 2023-06-24 ENCOUNTER — Other Ambulatory Visit: Payer: Self-pay | Admitting: Internal Medicine

## 2023-06-24 DIAGNOSIS — F411 Generalized anxiety disorder: Secondary | ICD-10-CM

## 2023-06-24 DIAGNOSIS — E1169 Type 2 diabetes mellitus with other specified complication: Secondary | ICD-10-CM

## 2023-07-12 ENCOUNTER — Other Ambulatory Visit

## 2023-07-17 ENCOUNTER — Other Ambulatory Visit: Payer: Self-pay | Admitting: Internal Medicine

## 2023-07-17 DIAGNOSIS — F411 Generalized anxiety disorder: Secondary | ICD-10-CM

## 2023-07-17 DIAGNOSIS — E1169 Type 2 diabetes mellitus with other specified complication: Secondary | ICD-10-CM

## 2023-07-17 DIAGNOSIS — E785 Hyperlipidemia, unspecified: Secondary | ICD-10-CM

## 2023-08-09 ENCOUNTER — Other Ambulatory Visit: Payer: Self-pay | Admitting: Internal Medicine

## 2023-08-09 DIAGNOSIS — I10 Essential (primary) hypertension: Secondary | ICD-10-CM

## 2023-08-13 ENCOUNTER — Ambulatory Visit: Admitting: Internal Medicine

## 2023-08-16 ENCOUNTER — Telehealth: Payer: Self-pay | Admitting: *Deleted

## 2023-08-16 ENCOUNTER — Encounter: Payer: Self-pay | Admitting: Internal Medicine

## 2023-08-16 ENCOUNTER — Ambulatory Visit: Admitting: Internal Medicine

## 2023-08-16 VITALS — BP 148/78 | HR 97 | Temp 98.2°F | Resp 16 | Ht 74.0 in | Wt 277.8 lb

## 2023-08-16 DIAGNOSIS — I10 Essential (primary) hypertension: Secondary | ICD-10-CM

## 2023-08-16 DIAGNOSIS — E1169 Type 2 diabetes mellitus with other specified complication: Secondary | ICD-10-CM

## 2023-08-16 DIAGNOSIS — E785 Hyperlipidemia, unspecified: Secondary | ICD-10-CM | POA: Diagnosis not present

## 2023-08-16 DIAGNOSIS — Z7985 Long-term (current) use of injectable non-insulin antidiabetic drugs: Secondary | ICD-10-CM

## 2023-08-16 DIAGNOSIS — G4733 Obstructive sleep apnea (adult) (pediatric): Secondary | ICD-10-CM | POA: Diagnosis not present

## 2023-08-16 LAB — POCT GLYCOSYLATED HEMOGLOBIN (HGB A1C): Hemoglobin A1C: 10.5 % — AB (ref 4.0–5.6)

## 2023-08-16 MED ORDER — TIRZEPATIDE 5 MG/0.5ML ~~LOC~~ SOAJ
5.0000 mg | SUBCUTANEOUS | 0 refills | Status: DC
Start: 2023-08-16 — End: 2023-09-14

## 2023-08-16 NOTE — Progress Notes (Signed)
 Established Patient Office Visit     CC/Reason for Visit: Follow-up chronic conditions  HPI: Travis King is a 53 y.o. male who is coming in today for the above mentioned reasons. Past Medical History is significant for: Hyperlipidemia, hypertension, type 2 diabetes, OSA, anxiety, obesity.  Feeling well without concerns or complaints.  I have not seen him in some time.  He states he has been compliant with his medications that include amlodipine , atorvastatin , metformin , Prozac .   Past Medical/Surgical History: Past Medical History:  Diagnosis Date   Allergy    Anxiety    Chronic kidney disease    kidney stones    Diabetes mellitus without complication (HCC)    GERD (gastroesophageal reflux disease)    Heart murmur    noted age 64 or 9- nothing mentioned currently    Hyperlipidemia    Hypertension    no meds - last checked was normal per pt    Sleep apnea    wears cpap     Past Surgical History:  Procedure Laterality Date   NO PAST SURGERIES      Social History:  reports that he has been smoking cigars. He has never used smokeless tobacco. He reports current alcohol use. He reports that he does not use drugs.  Allergies: No Known Allergies  Family History:  Family History  Problem Relation Age of Onset   Heart attack Mother    Hypertension Mother    Diabetes Mother    Dementia Father    Colon cancer Neg Hx    Colon polyps Neg Hx    Esophageal cancer Neg Hx    Stomach cancer Neg Hx    Rectal cancer Neg Hx      Current Outpatient Medications:    amLODipine  (NORVASC ) 5 MG tablet, TAKE 1 TABLET (5 MG TOTAL) BY MOUTH DAILY., Disp: 90 tablet, Rfl: 0   atorvastatin  (LIPITOR) 40 MG tablet, TAKE 1 TABLET BY MOUTH EVERY DAY, Disp: 90 tablet, Rfl: 0   FLUoxetine  (PROZAC ) 40 MG capsule, TAKE 1 CAPSULE(40 MG) BY MOUTH DAILY, Disp: 90 capsule, Rfl: 0   metFORMIN  (GLUCOPHAGE ) 1000 MG tablet, TAKE 1 TABLET BY MOUTH EVERY DAY WITH BREAKFAST, Disp: 180 tablet, Rfl:  0   Multiple Vitamins-Minerals (MULTIVITAMIN WITH MINERALS) tablet, Take 1 tablet by mouth daily., Disp: , Rfl:    tirzepatide  (MOUNJARO ) 5 MG/0.5ML Pen, Inject 5 mg into the skin once a week., Disp: 2 mL, Rfl: 0   doxycycline  (VIBRAMYCIN ) 100 MG capsule, Take 1 capsule (100 mg total) by mouth 2 (two) times daily. One po bid x 7 days (Patient not taking: Reported on 08/16/2023), Disp: 14 capsule, Rfl: 0  Review of Systems:  Negative unless indicated in HPI.   Physical Exam: Vitals:   08/16/23 1513  BP: (!) 148/78  Pulse: 97  Resp: 16  Temp: 98.2 F (36.8 C)  SpO2: 97%  Weight: 277 lb 12.8 oz (126 kg)  Height: 6' 2 (1.88 m)    Body mass index is 35.67 kg/m.   Physical Exam Vitals reviewed.  Constitutional:      Appearance: Normal appearance. He is obese.  HENT:     Head: Normocephalic and atraumatic.  Eyes:     Conjunctiva/sclera: Conjunctivae normal.  Cardiovascular:     Rate and Rhythm: Normal rate and regular rhythm.  Pulmonary:     Effort: Pulmonary effort is normal.     Breath sounds: Normal breath sounds.  Skin:    General: Skin is  warm and dry.  Neurological:     General: No focal deficit present.     Mental Status: He is alert and oriented to person, place, and time.  Psychiatric:        Mood and Affect: Mood normal.        Behavior: Behavior normal.        Thought Content: Thought content normal.        Judgment: Judgment normal.      Impression and Plan:  Type 2 diabetes mellitus with other specified complication, without long-term current use of insulin (HCC) -     POCT glycosylated hemoglobin (Hb A1C) -     Tirzepatide ; Inject 5 mg into the skin once a week.  Dispense: 2 mL; Refill: 0  Essential hypertension  Hyperlipidemia associated with type 2 diabetes mellitus (HCC)  Obstructive sleep apnea -     Tirzepatide ; Inject 5 mg into the skin once a week.  Dispense: 2 mL; Refill: 0 -     Ambulatory referral to Neurology   - A1c is very  uncontrolled despite metformin  at 10.5.  We discussed lifestyle changes.  He will start Mounjaro .  He was given samples for 2.5 mg and will start his prescription at 5 for next month.  This should also help with his sleep apnea. - Referral to [facility] neurology for his sleep apnea, he is due for repeat sleep study and a new machine. - Blood pressure is uncontrolled.  If still elevated at next visit, we will augment medication therapy. - Check lipids next visit, continue atorvastatin  40 mg.  Time spent:32 minutes reviewing chart, interviewing and examining patient and formulating plan of care.     Tully Theophilus Andrews, MD [facility]

## 2023-08-16 NOTE — Telephone Encounter (Signed)
 tirzepatide  (MOUNJARO ) 5 MG/0.5ML Pen  will need a PA.  Will send to PA team.

## 2023-08-17 ENCOUNTER — Other Ambulatory Visit (HOSPITAL_COMMUNITY): Payer: Self-pay

## 2023-08-17 ENCOUNTER — Telehealth: Payer: Self-pay

## 2023-08-17 NOTE — Telephone Encounter (Signed)
 Pharmacy Patient Advocate Encounter   Received notification from Pt Calls Messages that prior authorization for Mounjaro  5MG /0.5ML auto-injectors is required/requested.   Insurance verification completed.   The patient is insured through [facility] .   Per test claim: PA required; PA submitted to above mentioned insurance via CoverMyMeds Key/confirmation #/EOC [id number] Status is pending

## 2023-08-17 NOTE — Telephone Encounter (Signed)
 PA request has been Submitted. New Encounter has been or will be created for follow up. For additional info see Pharmacy Prior Auth telephone encounter from 08/17/23.

## 2023-08-18 ENCOUNTER — Other Ambulatory Visit: Payer: Self-pay | Admitting: Internal Medicine

## 2023-08-18 DIAGNOSIS — E119 Type 2 diabetes mellitus without complications: Secondary | ICD-10-CM

## 2023-08-20 ENCOUNTER — Other Ambulatory Visit (HOSPITAL_COMMUNITY): Payer: Self-pay

## 2023-08-20 NOTE — Telephone Encounter (Signed)
 Pharmacy Patient Advocate Encounter  Received notification from [facility] that Prior Authorization for Mounjaro  5MG /0.5ML auto-injectors  has been APPROVED from 08/20/23 to 08/19/24. Ran test claim, Copay is $25. This test claim was processed through [facility]- copay amounts may vary at other pharmacies due to pharmacy/plan contracts, or as the patient moves through the different stages of their insurance plan.   PA #/Case ID/Reference #: [id number]

## 2023-08-20 NOTE — Telephone Encounter (Signed)
 Patient is aware

## 2023-08-22 ENCOUNTER — Other Ambulatory Visit (HOSPITAL_COMMUNITY): Payer: Self-pay

## 2023-08-28 ENCOUNTER — Other Ambulatory Visit (HOSPITAL_COMMUNITY): Payer: Self-pay

## 2023-08-31 NOTE — Telephone Encounter (Signed)
 Error

## 2023-09-13 ENCOUNTER — Encounter: Payer: Self-pay | Admitting: Neurology

## 2023-09-13 ENCOUNTER — Encounter: Payer: Self-pay | Admitting: Internal Medicine

## 2023-09-13 ENCOUNTER — Ambulatory Visit: Admitting: Neurology

## 2023-09-13 VITALS — BP 160/90 | HR 78 | Ht 74.0 in | Wt 266.0 lb

## 2023-09-13 DIAGNOSIS — R030 Elevated blood-pressure reading, without diagnosis of hypertension: Secondary | ICD-10-CM | POA: Diagnosis not present

## 2023-09-13 DIAGNOSIS — R6889 Other general symptoms and signs: Secondary | ICD-10-CM | POA: Diagnosis not present

## 2023-09-13 DIAGNOSIS — G4733 Obstructive sleep apnea (adult) (pediatric): Secondary | ICD-10-CM

## 2023-09-13 DIAGNOSIS — E66811 Obesity, class 1: Secondary | ICD-10-CM | POA: Diagnosis not present

## 2023-09-13 DIAGNOSIS — E1169 Type 2 diabetes mellitus with other specified complication: Secondary | ICD-10-CM

## 2023-09-13 NOTE — Patient Instructions (Signed)
 It was nice to meet you today.  As discussed, I will order a home sleep test for re-evaluation of your sleep apnea, as testing was over 5 years ago. The home sleep test is done (HST) to re-establish/confirm the sleep apnea diagnosis and to get your a new machine through your insurance. Our sleep lab staff will reach out to you to arrange for pickup and for tutorial of the test equipment. I will write for a new machine after the HST confirms the OSA (obstructive sleep apnea) diagnosis. Please remember, you will not use your CPAP or autoPAP machine during the night of testing so we get diagnostic data, not treatment data. We will schedule a follow-up appointment after the new machine set-up date, typically within 31 to 89 days post treatment start.  Per insurance requirement, you will need to show compliance with usage and fulfill a minimum usage percentage.

## 2023-09-13 NOTE — Progress Notes (Signed)
 Subjective:    Patient ID: Travis King is a 53 y.o. male.  HPI    True Mar, MD, PhD [facility] [address], [address] [address] [place], [place] [address]  Dear Dr. Theophilus Andrews,   I saw your patient, Travis King, upon your kind request in my sleep clinic today for initial consultation of his sleep disorder, in particular, evaluation of his prior diagnosis of obstructive sleep apnea.  The patient is unaccompanied today.  As you know, [name] is a 53 year old male with an underlying medical history of reflux disease, diabetes, hypertension, hyperlipidemia, allergies, anxiety, kidney stones, and obesity, who was previously diagnosed with obstructive sleep apnea and placed on PAP therapy.  He has an older machine.  He also purchased a travel machine recently and has been using this machine for about a month.  His older CPAP machine has made abnormal noises and also has shown an error message that the motor life has been exceeded.  Prior sleep study results are not available for my review today, sleep testing was in 2012 per patient; as he recalls, he was diagnosed with severe sleep apnea at the time.  I reviewed your office note from 08/16/2023.  His latest A1c suggested suboptimal diabetes control.  He was started on Mounjaro .  He goes to bed generally between 11:30 PM and midnight and rise time is between 7:30 AM and 8.  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  He lives with 2 roommates, he does not share his bedroom with anyone.  His weight has been fluctuating, highest weight around 280 and lowest weight in the past 2 years has been around 220.  Compared to when he was first diagnosed with sleep apnea he weighs more.  He recalls being told he had severe sleep apnea.  He has benefited from PAP therapy.  He drinks quite a bit of caffeine in the form of coffee, 1 cup in the morning and diet soda, 2-3 bottles per day, 16.9 ounce size each.  He drinks alcohol about  2-3 times a week, about 2 drinks at a time.  He is a non-smoker of cigarettes but smokes cigars about twice a month.  He is not aware of any family history of sleep apnea.  His Past Medical History Is Significant For: Past Medical History:  Diagnosis Date   Allergy    Anxiety    Chronic kidney disease    kidney stones    Diabetes mellitus without complication (HCC)    GERD (gastroesophageal reflux disease)    Heart murmur    noted age 58 or 9- nothing mentioned currently    Hyperlipidemia    Hypertension    no meds - last checked was normal per pt    Sleep apnea    wears cpap     His Past Surgical History Is Significant For: Past Surgical History:  Procedure Laterality Date   NO PAST SURGERIES      His Family History Is Significant For: Family History  Problem Relation Age of Onset   Heart attack Mother    Hypertension Mother    Diabetes Mother    Dementia Father    Colon cancer Neg Hx    Colon polyps Neg Hx    Esophageal cancer Neg Hx    Stomach cancer Neg Hx    Rectal cancer Neg Hx    Sleep apnea Neg Hx     His Social History Is Significant For: Social History  Socioeconomic History   Marital status: Single    Spouse name: Not on file   Number of children: Not on file   Years of education: Not on file   Highest education level: Bachelor's degree (e.g., BA, AB, BS)  Occupational History   Not on file  Tobacco Use   Smoking status: Some Days    Types: Cigars   Smokeless tobacco: Never   Tobacco comments:    occ cigars   Vaping Use   Vaping status: Never Used  Substance and Sexual Activity   Alcohol use: Yes    Comment: occ    Drug use: No   Sexual activity: Not on file  Other Topics Concern   Not on file  Social History Narrative   Pt lives with family    Pt works    Social Drivers of Corporate investment banker Strain: Low Risk  (07/06/2022)   Overall Financial Resource Strain (CARDIA)    Difficulty of Paying Living Expenses: Not very hard   Food Insecurity: No Food Insecurity (07/06/2022)   Hunger Vital Sign    Worried About Running Out of Food in the Last Year: Never true    Ran Out of Food in the Last Year: Never true  Transportation Needs: No Transportation Needs (07/06/2022)   PRAPARE - Administrator, Civil Service (Medical): No    Lack of Transportation (Non-Medical): No  Physical Activity: Sufficiently Active (07/06/2022)   Exercise Vital Sign    Days of Exercise per Week: 4 days    Minutes of Exercise per Session: 40 min  Stress: No Stress Concern Present (07/06/2022)   [employer] of Occupational Health - Occupational Stress Questionnaire    Feeling of Stress : Only a little  Social Connections: Moderately Integrated (07/06/2022)   Social Connection and Isolation Panel    Frequency of Communication with Friends and Family: Three times a week    Frequency of Social Gatherings with Friends and Family: More than three times a week    Attends Religious Services: 1 to 4 times per year    Active Member of [employer] or Organizations: Yes    Attends Engineer, structural: More than 4 times per year    Marital Status: Never married    His Allergies Are:  No Known Allergies:   His Current Medications Are:  Outpatient Encounter Medications as of 09/13/2023  Medication Sig   amLODipine  (NORVASC ) 5 MG tablet TAKE 1 TABLET (5 MG TOTAL) BY MOUTH DAILY.   atorvastatin  (LIPITOR) 40 MG tablet TAKE 1 TABLET BY MOUTH EVERY DAY   FLUoxetine  (PROZAC ) 40 MG capsule TAKE 1 CAPSULE(40 MG) BY MOUTH DAILY   metFORMIN  (GLUCOPHAGE ) 1000 MG tablet TAKE 1 TABLET BY MOUTH EVERY DAY WITH BREAKFAST   Multiple Vitamins-Minerals (MULTIVITAMIN WITH MINERALS) tablet Take 1 tablet by mouth daily.   tirzepatide  (MOUNJARO ) 5 MG/0.5ML Pen Inject 5 mg into the skin once a week.   doxycycline  (VIBRAMYCIN ) 100 MG capsule Take 1 capsule (100 mg total) by mouth 2 (two) times daily. One po bid x 7 days (Patient not taking: Reported on  08/16/2023)   No facility-administered encounter medications on file as of 09/13/2023.  :   Review of Systems:  Out of a complete 14 point review of systems, all are reviewed and negative with the exception of these symptoms as listed below:   Review of Systems  Neurological:        Pt here for sleep consult Pt states  old machine doesn't work well Pt states uses his travel cpap nightly Pt states needs a new cpap machine Pt states last sleep study was 2013 Pt states hypertension    ESS:3 FSS:35     Objective:  Neurological Exam  Physical Exam Physical Examination:   Vitals:   09/13/23 1108  BP: (!) 160/90  Pulse: 78    General Examination: The patient is a very pleasant 53 y.o. male in no acute distress. He appears well-developed and well-nourished and well groomed.   HEENT: Normocephalic, atraumatic, pupils are equal, round and reactive to light, extraocular tracking is good without limitation to gaze excursion or nystagmus noted. Hearing is grossly intact. Face is symmetric with normal facial animation. Speech is clear with no dysarthria noted. There is no hypophonia. There is no lip, neck/head, jaw or voice tremor. Neck is supple with full range of passive and active motion. There are no carotid bruits on auscultation. Oropharynx exam reveals: mild mouth dryness, adequate dental hygiene and moderate airway crowding, due to Mallampati class II, tonsillar size 2+ on the right and 1-2+ on the left, neck circumference 18 inches, minimal overbite noted.  Tongue protrudes centrally and palate elevates symmetrically.  Chest: Clear to auscultation without wheezing, rhonchi or crackles noted.  Heart: S1+S2+0, regular and normal without murmurs, rubs or gallops noted.   Abdomen: Soft, non-tender and non-distended.  Extremities: There is no pitting edema in the distal lower extremities bilaterally.   Skin: Warm and dry without trophic changes noted.   Musculoskeletal: exam reveals no  obvious joint deformities.   Neurologically:  Mental status: The patient is awake, alert and oriented in all 4 spheres. His immediate and remote memory, attention, language skills and fund of knowledge are appropriate. There is no evidence of aphasia, agnosia, apraxia or anomia. Speech is clear with normal prosody and enunciation. Thought process is linear. Mood is normal and affect is normal.  Cranial nerves II - XII are as described above under HEENT exam.  Motor exam: Normal bulk, moving all 4 extremities without restriction, no obvious action or resting tremor.  Fine motor skills and coordination: grossly intact.  Cerebellar testing: No dysmetria or intention tremor. There is no truncal or gait ataxia.  Sensory exam: intact to light touch in the upper and lower extremities.  Gait, station and balance: He stands easily. No veering to one side is noted. No leaning to one side is noted. Posture is age-appropriate and stance is narrow based. Gait shows normal stride length and normal pace. No problems turning are noted.   Assessment and Plan:  In summary, Rockwell Zentz is a very pleasant 53 y.o.-year old male 53 year old male with an underlying medical history of reflux disease, diabetes, hypertension, hyperlipidemia, allergies, anxiety, kidney stones, and obesity, who presents for evaluation of his obstructive sleep apnea of several years duration.  He has been on PAP therapy, has not had reevaluation in years but reports compliance with PAP therapy.  Lately, he has been using a travel machine, he does not have a current DME company and has been buying supplies online.  While a laboratory attended sleep study is typically considered gold standard for evaluation of sleep disordered breathing, we mutually agreed to proceed with a home sleep test at this time.   I had a long chat with the patient about my findings and the diagnosis of sleep apnea, particularly OSA, its prognosis and treatment options.  We talked about medical/conservative treatments, surgical interventions and non-pharmacological approaches for symptom control.  I explained, in particular, the risks and ramifications of untreated moderate to severe OSA, especially with respect to developing cardiovascular disease down the road, including congestive heart failure (CHF), difficult to treat hypertension, cardiac arrhythmias (particularly A-fib), neurovascular complications including TIA, stroke and dementia. Even type 2 diabetes has, in part, been linked to untreated OSA. Symptoms of untreated OSA may include (but may not be limited to) daytime sleepiness, nocturia (i.e. frequent nighttime urination), memory problems, mood irritability and suboptimally controlled or worsening mood disorder such as depression and/or anxiety, lack of energy, lack of motivation, physical discomfort, as well as recurrent headaches, especially morning or nocturnal headaches. We talked about the importance of maintaining a healthy lifestyle and striving for healthy weight.  The importance of complete smoking cessation was also addressed (ie cigar smoking).  I recommended a sleep study at this time. I outlined the differences between a laboratory attended sleep study which is considered more comprehensive and accurate over the option of a home sleep test (HST); the latter may lead to underestimation of sleep disordered breathing in some instances and does not help with diagnosing upper airway resistance syndrome and is not accurate enough to diagnose primary central sleep apnea typically. I outlined possible surgical and non-surgical treatment options of OSA, including the use of a positive airway pressure (PAP) device (i.e. CPAP, AutoPAP/APAP or BiPAP in certain circumstances), a custom-made dental device (aka oral appliance, which would require a referral to a specialist dentist or orthodontist typically, and is generally speaking not considered for patients with full  dentures or edentulous state), upper airway surgical options, such as traditional UPPP (which is not considered a first-line treatment) or the Inspire device (hypoglossal nerve stimulator, which would involve a referral for consultation with an ENT surgeon, after careful selection, following inclusion criteria - also not first-line treatment). I explained the PAP treatment option to the patient in detail, as this is generally considered first-line treatment.  The patient indicated that he would be willing to continue with PAP therapy.  We will keep him posted as to the test results by phone call and/or MyChart messaging where possible.  We will plan to follow-up in sleep clinic accordingly as well.  I answered all his questions today and the patient was in agreement.   I encouraged him to call with any interim questions, concerns, problems or updates or email us  through MyChart.  Generally speaking, sleep test authorizations may take up to 2 weeks, sometimes less, sometimes longer, the patient is encouraged to get in touch with us  if they do not hear back from the sleep lab staff directly within the next 2 weeks.  Thank you very much for allowing me to participate in the care of this nice patient. If I can be of any further assistance to you please do not hesitate to call me at [phone].  Sincerely,   True Mar, MD, PhD

## 2023-09-14 ENCOUNTER — Telehealth: Payer: Self-pay | Admitting: Internal Medicine

## 2023-09-14 ENCOUNTER — Telehealth: Payer: Self-pay

## 2023-09-14 MED ORDER — TIRZEPATIDE 5 MG/0.5ML ~~LOC~~ SOAJ: 5.0000 mg | SUBCUTANEOUS | 0 refills | Status: AC

## 2023-09-14 NOTE — Telephone Encounter (Signed)
 Copied from CRM #[id number]. Topic: Clinical - Prescription Issue >> Sep 14, 2023 10:01 AM Gennette ORN wrote: Reason for CRM: Patient is having insurance issues for his medication named tirzepatide  (MOUNJARO ) 5 MG/0.5ML Pen. He stated they need more information for the insurance to cover the medication. Patient is stated he had a week 4 week trial and wants to know if it can get extended or not.

## 2023-09-14 NOTE — Telephone Encounter (Signed)
 See my chart encounter.

## 2023-09-14 NOTE — Telephone Encounter (Signed)
 Copied from CRM #[id number]. Topic: Clinical - Prescription Issue >> Sep 14, 2023 10:01 AM Gennette ORN wrote: Reason for CRM: Patient is having insurance issues for his medication named tirzepatide  (MOUNJARO ) 5 MG/0.5ML Pen. He stated they need more information for the insurance to cover the medication. Patient is stated he had a week 4 week trial and wants to know if it can get extended or not. >> Sep 14, 2023  3:11 PM Robinson H wrote: Patient following up on call earlier regarding his tirzepatide  (MOUNJARO ) 5 MG/0.5ML Pen states he's due for his next injection tomorrow and out. Wants to pick up a sample from office today  Cory [phone]

## 2023-09-21 ENCOUNTER — Telehealth: Payer: Self-pay | Admitting: Neurology

## 2023-09-21 NOTE — Telephone Encounter (Signed)
 HST Oscar pending.

## 2023-09-24 NOTE — Telephone Encounter (Signed)
 HST Legrand barrows: [id number] (exp. 09/21/23 to 12/20/23)

## 2023-10-21 ENCOUNTER — Other Ambulatory Visit: Payer: Self-pay | Admitting: Internal Medicine

## 2023-10-21 DIAGNOSIS — F411 Generalized anxiety disorder: Secondary | ICD-10-CM

## 2023-10-25 ENCOUNTER — Other Ambulatory Visit: Payer: Self-pay | Admitting: Internal Medicine

## 2023-10-25 DIAGNOSIS — E1169 Type 2 diabetes mellitus with other specified complication: Secondary | ICD-10-CM

## 2023-11-15 ENCOUNTER — Ambulatory Visit: Payer: Self-pay | Admitting: Internal Medicine

## 2023-11-15 DIAGNOSIS — E1169 Type 2 diabetes mellitus with other specified complication: Secondary | ICD-10-CM

## 2023-11-17 ENCOUNTER — Other Ambulatory Visit: Payer: Self-pay | Admitting: Internal Medicine

## 2023-11-17 DIAGNOSIS — E119 Type 2 diabetes mellitus without complications: Secondary | ICD-10-CM

## 2023-11-27 ENCOUNTER — Other Ambulatory Visit: Payer: Self-pay | Admitting: Medical Genetics

## 2023-11-27 DIAGNOSIS — Z006 Encounter for examination for normal comparison and control in clinical research program: Secondary | ICD-10-CM
